# Patient Record
Sex: Male | Born: 1970 | Race: Black or African American | Hispanic: No | Marital: Married | Smoking: Never smoker
Health system: Southern US, Community
[De-identification: ages and names within clinical notes are randomized; demographics above are authoritative.]

## PROBLEM LIST (undated history)

## (undated) DIAGNOSIS — F419 Anxiety disorder, unspecified: Secondary | ICD-10-CM

## (undated) DIAGNOSIS — D649 Anemia, unspecified: Secondary | ICD-10-CM

## (undated) DIAGNOSIS — M109 Gout, unspecified: Secondary | ICD-10-CM

## (undated) DIAGNOSIS — I10 Essential (primary) hypertension: Secondary | ICD-10-CM

## (undated) HISTORY — DX: Essential (primary) hypertension: I10

## (undated) HISTORY — PX: NO PAST SURGERIES: SHX2092

## (undated) HISTORY — DX: Anemia, unspecified: D64.9

## (undated) HISTORY — DX: Anxiety disorder, unspecified: F41.9

---

## 2007-01-25 ENCOUNTER — Emergency Department (HOSPITAL_COMMUNITY): Admission: EM | Admit: 2007-01-25 | Discharge: 2007-01-25 | Payer: Self-pay | Admitting: Emergency Medicine

## 2008-02-13 ENCOUNTER — Encounter: Admission: RE | Admit: 2008-02-13 | Discharge: 2008-02-13 | Payer: Self-pay | Admitting: Internal Medicine

## 2008-11-24 ENCOUNTER — Emergency Department (HOSPITAL_COMMUNITY): Admission: EM | Admit: 2008-11-24 | Discharge: 2008-11-25 | Payer: Self-pay | Admitting: Emergency Medicine

## 2008-11-28 ENCOUNTER — Inpatient Hospital Stay (HOSPITAL_COMMUNITY): Admission: EM | Admit: 2008-11-28 | Discharge: 2008-11-30 | Payer: Self-pay | Admitting: Emergency Medicine

## 2011-04-19 NOTE — Discharge Summary (Signed)
NAMELEVEON, PELZER                 ACCOUNT NO.:  [id number]   MEDICAL RECORD NO.:  [id number]          PATIENT TYPE:  INP   LOCATION:  5523                         FACILITY:  [facility]   PHYSICIAN:  Michelene Gardener, MD    DATE OF BIRTH:  Dec 25, 1970   DATE OF ADMISSION:  11/28/2008  DATE OF DISCHARGE:  11/30/2008                               DISCHARGE SUMMARY   DISCHARGE DIAGNOSES:  1. Community-acquired pneumonia.  2. Leukocytosis, secondary to pneumonia.  3. Hypokalemia that was corrected.   DISCHARGE MEDICATIONS:  1. Avelox 400 mg p.o. once daily x1 week.  2. Robitussin 10 mL p.o. q.4 h. as needed.   CONSULTATIONS:  None.   PROCEDURES:  None.   RADIOLOGY STUDIES:  1. Chest x-ray on November 28, 2008, showed medial right upper lobe      air space disease consistent with pneumonia.  2. Repeat x-ray on November 29, 2008, showed persistent right upper      lobe pneumonia.  Followup with primary doctor within 1-2 weeks.   COURSE OF HOSPITALIZATION:  This is a 40 year old male with no past  medical history who presented to the hospital with clinical picture that  consistent with pneumonia.  He was initially evaluated on November 24, 2008, in [facility] ER and at that time he was given antibiotics that  was not working.  Came in again with worsening symptoms and his x-ray  showed pneumonia.  The patient was admitted to the hospital for further  evaluation.  He was started on IV Rocephin and Zithromax.  He was put in  inhaler treatments as needed.  His saturation remained stable and he did  not require any oxygen.  Blood culture was done x2 and it was normal.  The patient improved very quick in the hospital.  His shortness of  breath is almost resolved.  At the time of discharge, the patient was  almost back to his baseline.  He still had some cough, which is mainly  dry with occasional sputum.  I gave him 1 week of Avelox.  I advised him  to follow with his primary doctor and  consider repeat of his x-ray.  His  white counts normalized with antibiotics.  His potassium has been in  the low side and that was corrected with potassium chloride.   Total assessment time is 40 minutes.      Michelene Gardener, MD  Electronically Signed    NAE/MEDQ  D:  11/30/2008  T:  11/30/2008  Job:  [id number]

## 2011-04-19 NOTE — H&P (Signed)
Theodore Morton, Theodore Morton                 ACCOUNT NO.:  [id number]   MEDICAL RECORD NO.:  [id number]          PATIENT TYPE:  INP   LOCATION:  5523                         FACILITY:  [facility]   PHYSICIAN:  Joylene John, MD       DATE OF BIRTH:  Aug 12, 1971   DATE OF ADMISSION:  11/28/2008  DATE OF DISCHARGE:                              HISTORY & PHYSICAL   REASON FOR ADMISSION:  Pneumonia.   HISTORY OF PRESENT ILLNESS:  This is a 40 year old African American male  with no significant past medical history who was recently here in the ER  on November 25, 2008, with upper respiratory tract symptoms.  The  patient's chest x-ray at that time was clear and he was thought to have  bronchitis.  The patient was started on doxycycline and discharged.  The  patient tells me that following his discharge, he remained febrile in  spite of taking doxycycline.  He went to his primary care physicians on  November 26, 2008, where he was given a shot of antibiotics (the patient  is unsure of the name of an antibiotic).  The patient tells me that  following that shot, his fever defervesced; however, starting yesterday,  he started having increasing abdominal pain, was febrile again, was  nauseous, unable to keep p.o. down with vomiting x2.  The patient  decided to come to the ER for further management.  In the ER, chest x-  ray was done which showed right upper lobe pneumonia.  In the ER, the  patient was given ceftriaxone and azithromycin and the patient was  admitted to [facility] for further management.   PAST MEDICAL HISTORY:  No significant past medical history.   SOCIAL HISTORY:  No alcohol, drugs, or tobacco.   FAMILY HISTORY:  Noncontributory.   ALLERGIES:  No known allergies.   REVIEW OF SYSTEMS:  Positive for fever and cough.  Otherwise, the 14-  point review of system was negative.  Chest x-ray shows medial right  upper lobe and paratracheal opacity.   Laboratory and Imaging  Pertinent  labs show white count of 11.6, hemoglobin 11.9, hematocrit 35,  platelets 445.  Sodium 137, potassium 3.6, chloride 103, bicarb 10,  creatinine 1.2, glucose 106.   PHYSICAL EXAMINATION:  VITAL SIGNS:  Temperature of 102, blood pressure  129/88, pulse of 128, respirations 16, O2 sat is 98% on room air.  GENERAL:  The patient is in no acute distress.  No JVD appreciated.  No  icterus or pallor seen.  LUNGS:  Demonstrates crackles present right upper lobe with increased  egophony.  CARDIOVASCULAR:  Tachycardic.  LOWER EXTREMITIES:  No edema appreciated.   ASSESSMENT AND PLAN:  This is a 40 year old African American male being  admitted for IV antibiotics and treatment of his pneumonia.  We will  admit the patient to a regular bed.  We will continue the patient on  ceftriaxone  and azithromycin IV.  Once the patient is afebrile for 24-48 hours, he  can be transitioned to oral antibiotics.  The patient tells me that he  is hungry.  We will give antiemetic medications and following which, we  will allow the patient to have liquids and then to advance diet as  tolerated.      Joylene John, MD  Electronically Signed     RP/MEDQ  D:  11/28/2008  T:  11/29/2008  Job:  [id number]

## 2011-09-09 LAB — CBC
Hemoglobin: 10 g/dL — ABNORMAL LOW (ref 13.0–17.0)
MCV: 86.9 fL (ref 78.0–100.0)
Platelets: 409 10*3/uL — ABNORMAL HIGH (ref 150–400)
Platelets: 445 10*3/uL — ABNORMAL HIGH (ref 150–400)
RBC: 3.57 MIL/uL — ABNORMAL LOW (ref 4.22–5.81)
RDW: 11.9 % (ref 11.5–15.5)
WBC: 11.6 10*3/uL — ABNORMAL HIGH (ref 4.0–10.5)
WBC: 9.4 10*3/uL (ref 4.0–10.5)
WBC: 9.9 10*3/uL (ref 4.0–10.5)

## 2011-09-09 LAB — POTASSIUM: Potassium: 3.7 mEq/L (ref 3.5–5.1)

## 2011-09-09 LAB — COMPREHENSIVE METABOLIC PANEL
ALT: 24 U/L (ref 0–53)
AST: 20 U/L (ref 0–37)
CO2: 26 mEq/L (ref 19–32)
Chloride: 104 mEq/L (ref 96–112)
Creatinine, Ser: 1.12 mg/dL (ref 0.4–1.5)
GFR calc Af Amer: >60 mL/min (ref >60)
GFR calc non Af Amer: >60 mL/min (ref >60)
Glucose, Bld: 118 mg/dL — ABNORMAL HIGH (ref 70–99)
Sodium: 138 mEq/L (ref 135–145)
Total Bilirubin: 0.6 mg/dL (ref 0.3–1.2)

## 2011-09-09 LAB — POCT I-STAT, CHEM 8
BUN: 10 mg/dL (ref 6–23)
Creatinine, Ser: 1.2 mg/dL (ref 0.4–1.5)
Potassium: 3.6 mEq/L (ref 3.5–5.1)
Sodium: 137 mEq/L (ref 135–145)
TCO2: 24 mmol/L (ref 0–100)

## 2011-09-09 LAB — DIFFERENTIAL
Basophils Absolute: 0 10*3/uL (ref 0.0–0.1)
Eosinophils Relative: 0 % (ref 0–5)
Lymphocytes Relative: 10 % — ABNORMAL LOW (ref 12–46)
Lymphs Abs: 1.2 10*3/uL (ref 0.7–4.0)
Neutro Abs: 9.9 10*3/uL — ABNORMAL HIGH (ref 1.7–7.7)
Neutrophils Relative %: 85 % — ABNORMAL HIGH (ref 43–77)

## 2011-09-09 LAB — CULTURE, BLOOD (ROUTINE X 2)

## 2011-09-09 LAB — MAGNESIUM: Magnesium: 2.1 mg/dL (ref 1.5–2.5)

## 2014-12-22 ENCOUNTER — Emergency Department: Payer: Self-pay | Admitting: Emergency Medicine

## 2014-12-22 LAB — CBC
HCT: 37.4 % — ABNORMAL LOW (ref 40.0–52.0)
HGB: 12 g/dL — AB (ref 13.0–18.0)
MCH: 28.3 pg (ref 26.0–34.0)
MCHC: 32 g/dL (ref 32.0–36.0)
MCV: 88 fL (ref 80–100)
PLATELETS: 333 10*3/uL (ref 150–440)
RBC: 4.23 10*6/uL — AB (ref 4.40–5.90)
RDW: 12.4 % (ref 11.5–14.5)
WBC: 5.7 10*3/uL (ref 3.8–10.6)

## 2014-12-22 LAB — BASIC METABOLIC PANEL
ANION GAP: 7 (ref 7–16)
BUN: 15 mg/dL (ref 7–18)
CALCIUM: 8.4 mg/dL — AB (ref 8.5–10.1)
Chloride: 106 mmol/L (ref 98–107)
Co2: 27 mmol/L (ref 21–32)
Creatinine: 1.45 mg/dL — ABNORMAL HIGH (ref 0.60–1.30)
GFR CALC NON AF AMER: 56 — AB
GLUCOSE: 97 mg/dL (ref 65–99)
OSMOLALITY: 280 (ref 275–301)
Potassium: 3.9 mmol/L (ref 3.5–5.1)
Sodium: 140 mmol/L (ref 136–145)

## 2014-12-22 LAB — TROPONIN I: Troponin-I: <0.02 ng/mL

## 2015-11-08 ENCOUNTER — Emergency Department
Admission: EM | Admit: 2015-11-08 | Discharge: 2015-11-08 | Disposition: A | Discharge status: Home / Self Care | Payer: Managed Care, Other (non HMO) | Attending: Emergency Medicine | Admitting: Emergency Medicine

## 2015-11-08 DIAGNOSIS — Y9289 Other specified places as the place of occurrence of the external cause: Secondary | ICD-10-CM | POA: Insufficient documentation

## 2015-11-08 DIAGNOSIS — IMO0002 Reserved for concepts with insufficient information to code with codable children: Secondary | ICD-10-CM

## 2015-11-08 DIAGNOSIS — Y998 Other external cause status: Secondary | ICD-10-CM | POA: Diagnosis not present

## 2015-11-08 DIAGNOSIS — W268XXA Contact with other sharp object(s), not elsewhere classified, initial encounter: Secondary | ICD-10-CM | POA: Insufficient documentation

## 2015-11-08 DIAGNOSIS — S61011A Laceration without foreign body of right thumb without damage to nail, initial encounter: Secondary | ICD-10-CM | POA: Insufficient documentation

## 2015-11-08 DIAGNOSIS — Y93G1 Activity, food preparation and clean up: Secondary | ICD-10-CM | POA: Insufficient documentation

## 2015-11-08 MED ORDER — LIDOCAINE HCL (PF) 1 % IJ SOLN
INTRAMUSCULAR | Status: AC
  Administered 2015-11-08: 5 mL via INTRADERMAL
  Filled 2015-11-08: qty 5

## 2015-11-08 MED ORDER — LIDOCAINE HCL (PF) 1 % IJ SOLN
5.0000 mL | Freq: Once | INTRAMUSCULAR | Status: AC
  Administered 2015-11-08: 5 mL via INTRADERMAL

## 2015-11-08 MED ORDER — BACITRACIN ZINC 500 UNIT/GM EX OINT
1.0000 "application " | TOPICAL_OINTMENT | Freq: Two times a day (BID) | CUTANEOUS | Status: DC
  Administered 2015-11-08: 1 via TOPICAL
  Filled 2015-11-08: qty 0.9

## 2015-11-08 NOTE — ED Notes (Signed)
Laceration to right hand thumb with broken dish.

## 2015-11-08 NOTE — ED Provider Notes (Signed)
[facility] Emergency Department Provider Note ____________________________________________  Time seen: Approximately 10:21 AM  I have reviewed the triage vital signs and the nursing notes.   HISTORY  Chief Complaint Laceration   HPI Theodore Morton is a 44 y.o. male who presents to the emergency department for evaluation of a laceration to the right thumb. A plate broke this morning while washing dishes and cut the top of the thumb. No difficulty moving the thumb. Bleeding well controlled.  History reviewed. No pertinent past medical history.  There are no active problems to display for this patient.   History reviewed. No pertinent past surgical history.  No current outpatient prescriptions on file.  Allergies Review of patient's allergies indicates no known allergies.  No family history on file.  Social History Social History  Substance Use Topics  . Smoking status: Never Smoker   . Smokeless tobacco: None  . Alcohol Use: None    Review of Systems   Constitutional: No fever/chills Eyes: No visual changes. ENT: No congestion or rhinorrhea Cardiovascular: Denies chest pain. Respiratory: Denies shortness of breath. Gastrointestinal: No abdominal pain.  No nausea, no vomiting.  No diarrhea.  No constipation. Genitourinary: Negative for dysuria. Musculoskeletal: Negative for back pain. Skin: Positive for laceration. Neurological: Negative for headaches, focal weakness or numbness.  10-point ROS otherwise negative.  ____________________________________________   PHYSICAL EXAM:  VITAL SIGNS: ED Triage Vitals  Enc Vitals Group     BP 11/08/15 0855 166/105 mmHg     Pulse Rate 11/08/15 0855 79     Resp 11/08/15 0855 20     Temp 11/08/15 0855 98 F (36.7 C)     Temp Source 11/08/15 0855 Oral     SpO2 11/08/15 0855 96 %     Weight 11/08/15 0855 200 lb (90.719 kg)     Height 11/08/15 0855  (1.676 m)     Head Cir --      Peak Flow  --      Pain Score 11/08/15 0856 5     Pain Loc --      Pain Edu? --      Excl. in GC? --     Constitutional: Alert and oriented. Well appearing and in no acute distress. Eyes: Conjunctivae are normal. PERRL. EOMI. Head: Atraumatic. Nose: No congestion/rhinnorhea. Mouth/Throat: Mucous membranes are moist.  Oropharynx non-erythematous. No oral lesions. Neck: No stridor. Cardiovascular: Normal rate, regular rhythm.  Good peripheral circulation. Respiratory: Normal respiratory effort.  No retractions. Lungs CTAB. Gastrointestinal: Soft and nontender. No distention. No abdominal bruits. Musculoskeletal: No lower extremity tenderness nor edema.  No joint effusions. Neurologic:  Normal speech and language. No gross focal neurologic deficits are appreciated. Speech is normal. No gait instability. Skin:  3cm laceration to the dorsal aspect of the right thumb; Negative for petechiae.  Psychiatric: Mood and affect are normal. Speech and behavior are normal.  ____________________________________________   LABS (all labs ordered are listed, but only abnormal results are displayed)  Labs Reviewed - No data to display ____________________________________________  EKG   ____________________________________________  RADIOLOGY  Not indicated. ____________________________________________   PROCEDURES  Procedure(s) performed:   LACERATION REPAIR Performed by: Kem Boroughs Authorized by: Kem Boroughs Consent: Verbal consent obtained. Risks and benefits: risks, benefits and alternatives were discussed Consent given by: patient Patient identity confirmed: provided demographic data Prepped and Draped in normal sterile fashion Wound explored  Laceration Location: dorsal aspect of the right thumb  Laceration Length: 3cm  No Foreign Bodies seen  or palpated  Anesthesia: local infiltration  Local anesthetic: lidocaine 1% without epinephrine  Anesthetic total: 3.5  ml  Irrigation method: syringe Amount of cleaning: standard  Skin closure: 5-0 Nylon  Number of sutures: 6  Technique: simple interrupted  Patient tolerance: Patient tolerated the procedure well with no immediate complications.  ____________________________________________   INITIAL IMPRESSION / ASSESSMENT AND PLAN / ED COURSE  Pertinent labs & imaging results that were available during my care of the patient were reviewed by me and considered in my medical decision making (see chart for details).  Patient was given wound care instructions. Bacitracin and sterile bandage applied. He is to follow up with the PCP or go to urgent care for suture removal in 10 days. He was advised to return to the emergency department for symptoms of concern if unable to schedule an appointment. ____________________________________________   FINAL CLINICAL IMPRESSION(S) / ED DIAGNOSES  Final diagnoses:  Laceration       Chinita PesterCari B Ellanor Feuerstein, FNP 11/08/15 1025  Sharyn CreamerMark Quale, MD 11/08/15 [phone]

## 2015-11-08 NOTE — ED Notes (Signed)
Right thumb lac. Bleeding controlled at this time

## 2016-11-04 ENCOUNTER — Ambulatory Visit (INDEPENDENT_AMBULATORY_CARE_PROVIDER_SITE_OTHER): Payer: Self-pay

## 2016-11-04 ENCOUNTER — Ambulatory Visit (INDEPENDENT_AMBULATORY_CARE_PROVIDER_SITE_OTHER): Payer: Self-pay | Admitting: Family Medicine

## 2016-11-04 ENCOUNTER — Encounter: Payer: Self-pay | Admitting: Family Medicine

## 2016-11-04 VITALS — BP 135/88 | HR 94 | Temp 98.4°F | Resp 14 | Ht 67.5 in | Wt 209.5 lb

## 2016-11-04 DIAGNOSIS — G8929 Other chronic pain: Secondary | ICD-10-CM

## 2016-11-04 DIAGNOSIS — M25532 Pain in left wrist: Secondary | ICD-10-CM

## 2016-11-04 DIAGNOSIS — Z130 Encounter for screening for diseases of the blood and blood-forming organs and certain disorders involving the immune mechanism: Secondary | ICD-10-CM

## 2016-11-04 DIAGNOSIS — I10 Essential (primary) hypertension: Secondary | ICD-10-CM | POA: Insufficient documentation

## 2016-11-04 DIAGNOSIS — M25512 Pain in left shoulder: Secondary | ICD-10-CM

## 2016-11-04 DIAGNOSIS — E669 Obesity, unspecified: Secondary | ICD-10-CM

## 2016-11-04 LAB — LIPID PANEL
Cholesterol: 183 mg/dL (ref 0–200)
HDL: 48.5 mg/dL (ref >39.00)
LDL Cholesterol: 112 mg/dL — ABNORMAL HIGH (ref 0–99)
NONHDL: 134.06
Total CHOL/HDL Ratio: 4
Triglycerides: 111 mg/dL (ref 0.0–149.0)
VLDL: 22.2 mg/dL (ref 0.0–40.0)

## 2016-11-04 LAB — COMPREHENSIVE METABOLIC PANEL
ALK PHOS: 57 U/L (ref 39–117)
ALT: 20 U/L (ref 0–53)
AST: 20 U/L (ref 0–37)
Albumin: 4.5 g/dL (ref 3.5–5.2)
BILIRUBIN TOTAL: 0.6 mg/dL (ref 0.2–1.2)
BUN: 16 mg/dL (ref 6–23)
CO2: 30 mEq/L (ref 19–32)
Calcium: 10 mg/dL (ref 8.4–10.5)
Chloride: 101 mEq/L (ref 96–112)
Creatinine, Ser: 1.22 mg/dL (ref 0.40–1.50)
GFR: 81.57 mL/min (ref >60.00)
GLUCOSE: 101 mg/dL — AB (ref 70–99)
POTASSIUM: 5.1 meq/L (ref 3.5–5.1)
SODIUM: 137 meq/L (ref 135–145)
TOTAL PROTEIN: 8 g/dL (ref 6.0–8.3)

## 2016-11-04 LAB — CBC
HCT: 37.7 % — ABNORMAL LOW (ref 39.0–52.0)
Hemoglobin: 12.2 g/dL — ABNORMAL LOW (ref 13.0–17.0)
MCHC: 32.4 g/dL (ref 30.0–36.0)
MCV: 87.2 fl (ref 78.0–100.0)
Platelets: 369 10*3/uL (ref 150.0–400.0)
RBC: 4.32 Mil/uL (ref 4.22–5.81)
RDW: 12.2 % (ref 11.5–15.5)
WBC: 4.1 10*3/uL (ref 4.0–10.5)

## 2016-11-04 LAB — HEMOGLOBIN A1C: HEMOGLOBIN A1C: 5.2 % (ref 4.6–6.5)

## 2016-11-04 MED ORDER — MELOXICAM 15 MG PO TABS: 15.0000 mg | ORAL_TABLET | Freq: Every day | ORAL | 0 refills | Status: DC

## 2016-11-04 NOTE — Assessment & Plan Note (Signed)
Prior diagnosis of hypertension. I need an additional reading to confirm hypertension per Will follow-up in one month.

## 2016-11-04 NOTE — Patient Instructions (Signed)
We will call with your results.  Take the medication daily with food.  Follow up in 1 month.  Take care  Dr. Adriana Simasook

## 2016-11-04 NOTE — Progress Notes (Signed)
Pre visit review using our clinic review tool, if applicable. No additional management support is needed unless otherwise documented below in the visit note. 

## 2016-11-04 NOTE — Assessment & Plan Note (Signed)
[facility]. Treating with Mobic. Follow up in 1 month.

## 2016-11-04 NOTE — Progress Notes (Signed)
Subjective:  Patient ID: Theodore RunnerRicky Borak, male    DOB: 05/13/1971  Age: 45 y.o. MRN: [id number]  CC: L arm pain (shoulder, elbow, wrist)  HPI Theodore Morton is a 45 y.o. male presents to the clinic today with the above complaint.  Left arm pain  Patient states that he's had severe arm pain for the past 3 months.  He states that the pain is predominantly in the left shoulder. He also had some recent elbow discomfort as well as wrist pain.  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  He does not recall any discrete injury. He does work nearly 7 days a week and this heavy objects the predominance of the day.  He states that the pain is constant in his shoulder. The pains of his wrist and elbow come and go.  He has taken some Aleve without significant improvement.  No other reported arthralgias.  He reports that he has improvement with rest but continues to have pain at home after long days work. He has difficulty lifting small objects at home after working all day.  No reported swelling.  No other associated symptoms. No other complaints at this time.  Hypertension  Patient reports a prior history of hypertension.  He is not on any medication currently.  I have no records of this.  His blood pressure is mildly elevated today.  We'll discuss.  PMH, Surgical Hx, Family Hx, Social History reviewed and updated as below.  Past Medical History:  Diagnosis Date  . Hypertension    Past Surgical History:  Procedure Laterality Date  . NO PAST SURGERIES     Family History  Problem Relation Age of Onset  . Breast cancer Mother   . Diabetes Father    Social History  Substance Use Topics  . Smoking status: Never Smoker  . Smokeless tobacco: Never Used  . Alcohol use 0.6 - 1.2 oz/week    1 - 2 Standard drinks or equivalent per week    Review of Systems  Musculoskeletal:       Left arm pain.  Psychiatric/Behavioral:       Stress.  All  other systems reviewed and are negative.  Objective:   Today's Vitals: BP 135/88 (BP Location: Left Arm, Patient Position: Sitting, Cuff Size: Large)   Pulse 94   Temp 98.4 F (36.9 C) (Oral)   Resp 14   Ht 5' 7.5" (1.715 m)   Wt 209 lb 8 oz (95 kg)   SpO2 98%   BMI 32.33 kg/m   Physical Exam  Constitutional: He is oriented to person, place, and time. He appears well-developed. No distress.  HENT:  Head: Normocephalic and atraumatic.  Mouth/Throat: Oropharynx is clear and moist.  Normal TM's bilaterally.   Eyes: Conjunctivae are normal. No scleral icterus.  Neck: Normal range of motion. Neck supple.  Cardiovascular: Normal rate and regular rhythm.   No murmur heard. Pulmonary/Chest: Effort normal and breath sounds normal. He has no wheezes. He has no rales.  Abdominal: Soft. He exhibits no distension. There is no tenderness. There is no rebound.  Musculoskeletal:  Shoulder: Left Inspection reveals no abnormalities, atrophy or asymmetry. Palpation with tenderness over AC joint. ROM is full in all planes. Rotator cuff strength normal throughout. No signs of impingement with negative Hawkin's tests, empty can.  Elbow: Left Unremarkable to inspection. Range of motion full pronation, supination, flexion, extension. Stable to varus, valgus stress. No discrete areas of tenderness  to palpation.  Wrist: Left Inspection normal with no visible erythema or swelling. ROM smooth and normal with good flexion and extension and ulnar/radial deviation that is symmetrical with opposite wrist. Palpation with tenderness diffusely over the dorsum of the wrist.    Neurological: He is alert and oriented to person, place, and time.  Skin: No rash noted.  Psychiatric: He has a normal mood and affect.  Vitals reviewed.  Assessment & Plan:   Problem List Items Addressed This Visit    Obesity (BMI 30-39.9)   Relevant Orders   Hemoglobin A1c   Comprehensive metabolic panel   Lipid panel     Left wrist pain    New problem. X-ray today. Treating with meloxicam.      Relevant Orders   DG Wrist Complete Left   Essential hypertension    Prior diagnosis of hypertension. I need an additional reading to confirm hypertension per Will follow-up in one month.      Chronic left shoulder pain - Primary    [facility]. [facility] today. Treating with Mobic. Follow up in 1 month.      Relevant Orders   DG Shoulder Left    Other Visit Diagnoses    Screening for deficiency anemia       Relevant Orders   CBC      Outpatient Encounter Prescriptions as of 11/04/2016  Medication Sig  . meloxicam (MOBIC) 15 MG tablet Take 1 tablet (15 mg total) by mouth daily.   No facility-administered encounter medications on file as of 11/04/2016.     Follow-up: 1 month  Colan Laymon DO [facility]

## 2016-11-04 NOTE — Assessment & Plan Note (Signed)
New problem. X-ray today. Treating with meloxicam.

## 2016-12-08 ENCOUNTER — Encounter: Payer: Self-pay | Admitting: Family Medicine

## 2016-12-08 ENCOUNTER — Ambulatory Visit (INDEPENDENT_AMBULATORY_CARE_PROVIDER_SITE_OTHER): Payer: BLUE CROSS/BLUE SHIELD | Admitting: Family Medicine

## 2016-12-08 VITALS — BP 123/84 | HR 110 | Temp 98.8°F | Resp 14 | Wt 210.4 lb

## 2016-12-08 DIAGNOSIS — G8929 Other chronic pain: Secondary | ICD-10-CM | POA: Diagnosis not present

## 2016-12-08 DIAGNOSIS — D649 Anemia, unspecified: Secondary | ICD-10-CM

## 2016-12-08 DIAGNOSIS — I10 Essential (primary) hypertension: Secondary | ICD-10-CM | POA: Diagnosis not present

## 2016-12-08 DIAGNOSIS — M25512 Pain in left shoulder: Secondary | ICD-10-CM | POA: Diagnosis not present

## 2016-12-08 NOTE — Assessment & Plan Note (Addendum)
New problem. Suspect from GI loss given reports of hematochezia. Stool cards and labs today. Will await results prior to sending to GI.

## 2016-12-08 NOTE — Assessment & Plan Note (Signed)
Resolved. Doing well on PRN Mobic (not using regularly currently).

## 2016-12-08 NOTE — Patient Instructions (Signed)
We will call regarding the labs.  You will likely need to see GI.  Follow up in 6 months.  Take care  Dr. Adriana Simasook

## 2016-12-08 NOTE — Assessment & Plan Note (Signed)
BP well controlled. Will continue to monitor.

## 2016-12-08 NOTE — Progress Notes (Signed)
Pre visit review using our clinic review tool, if applicable. No additional management support is needed unless otherwise documented below in the visit note. 

## 2016-12-08 NOTE — Progress Notes (Signed)
Subjective:  Patient ID: Theodore Morton, male    DOB: 1971/06/04  Age: 46 y.o. MRN: [id number]  CC: Follow up  HPI:  46 year old male presents for follow-up.  Left shoulder pain  Has improved dramatically. Has essentially resolved.  He is taking meloxicam as needed.  Doing well at this time.  Hypertension  BP improved today.  He is not on pharmacotherapy.  Anemia  New problem.  His most recent labs were obtained and revealed a mild anemia of 12.2. Normocytic anemia.  He endorsed no issues regarding hematochezia or melena at our prior visit  Today when asked, patient states that he's had intermittent hematochezia over the past year.  No associated pain. No issues with constipation. No history of hemorrhoids.  Patient denies fatigue, night sweats, fevers, chills. No changes in his bowel movements.  Patient is in need of further workup regarding his anemia including stool cards.  Social Hx   Social History   Social History  . Marital status: Married    Spouse name: N/A  . Number of children: N/A  . Years of education: N/A   Social History Main Topics  . Smoking status: Never Smoker  . Smokeless tobacco: Never Used  . Alcohol use 0.6 - 1.2 oz/week    1 - 2 Standard drinks or equivalent per week  . Drug use: No  . Sexual activity: Yes    Partners: Female   Other Topics Concern  . None   Social History Narrative  . None   Review of Systems  Constitutional: Negative.   Gastrointestinal: Positive for blood in stool.   Objective:  BP 123/84   Pulse (!) 110   Temp 98.8 F (37.1 C) (Oral)   Resp 14   Wt 210 lb 6.4 oz (95.4 kg)   SpO2 98%   BMI 32.47 kg/m   BP/Weight 12/08/2016 11/04/2016 11/08/2015  Systolic BP 123 135 166  Diastolic BP 84 88 105  Wt. (Lbs) 210.4 209.5 200  BMI 32.47 32.33 32.3   Physical Exam  Constitutional: He is oriented to person, place, and time. He appears well-developed. No distress.  Cardiovascular: Normal rate and regular  rhythm.   Pulmonary/Chest: Effort normal and breath sounds normal.  Musculoskeletal:  Normal ROM of the shoulder. No current tenderness.  Neurological: He is alert and oriented to person, place, and time.  Psychiatric: He has a normal mood and affect.  Vitals reviewed.  Lab Results  Component Value Date   WBC 4.1 11/04/2016   HGB 12.2 (L) 11/04/2016   HCT 37.7 (L) 11/04/2016   PLT 369.0 11/04/2016   GLUCOSE 101 (H) 11/04/2016   CHOL 183 11/04/2016   TRIG 111.0 11/04/2016   HDL 48.50 11/04/2016   LDLCALC 112 (H) 11/04/2016   ALT 20 11/04/2016   AST 20 11/04/2016   NA 137 11/04/2016   K 5.1 11/04/2016   CL 101 11/04/2016   CREATININE 1.22 11/04/2016   BUN 16 11/04/2016   CO2 30 11/04/2016   HGBA1C 5.2 11/04/2016    Assessment & Plan:   Problem List Items Addressed This Visit    Essential hypertension    BP well controlled. Will continue to monitor.      Chronic left shoulder pain - Primary    Resolved. Doing well on PRN Mobic (not using regularly currently).      Anemia    New problem. Suspect from GI loss given reports of hematochezia. Stool cards and labs today. Will await results prior to sending  to GI.      Relevant Orders   Fecal occult blood, imunochemical   Iron, TIBC and Ferritin Panel   B12   Folate   CBC     Follow-up: 6 months  Ashanti Ratti Adriana Simasook DO [facility]

## 2016-12-09 LAB — IRON,TIBC AND FERRITIN PANEL
%SAT: 14 % — AB (ref 15–60)
FERRITIN: 155 ng/mL (ref 20–380)
Iron: 47 ug/dL — ABNORMAL LOW (ref 50–180)
TIBC: 330 ug/dL (ref 250–425)

## 2016-12-09 LAB — CBC
HEMATOCRIT: 37.5 % — AB (ref 39.0–52.0)
HEMOGLOBIN: 12.2 g/dL — AB (ref 13.0–17.0)
MCHC: 32.5 g/dL (ref 30.0–36.0)
MCV: 87.3 fl (ref 78.0–100.0)
Platelets: 419 10*3/uL — ABNORMAL HIGH (ref 150.0–400.0)
RBC: 4.3 Mil/uL (ref 4.22–5.81)
RDW: 12.4 % (ref 11.5–15.5)
WBC: 5.5 10*3/uL (ref 4.0–10.5)

## 2016-12-09 LAB — FOLATE: Folate: 5.6 ng/mL — ABNORMAL LOW (ref >5.9)

## 2016-12-09 LAB — VITAMIN B12: Vitamin B-12: 149 pg/mL — ABNORMAL LOW (ref 211–911)

## 2016-12-12 ENCOUNTER — Other Ambulatory Visit: Payer: Self-pay | Admitting: Family Medicine

## 2016-12-12 MED ORDER — FERROUS SULFATE 325 (65 FE) MG PO TABS: 325.0000 mg | ORAL_TABLET | Freq: Two times a day (BID) | ORAL | 0 refills | Status: DC

## 2016-12-12 MED ORDER — FOLIC ACID 1 MG PO TABS: 1.0000 mg | ORAL_TABLET | Freq: Every day | ORAL | 0 refills | Status: DC

## 2016-12-13 ENCOUNTER — Other Ambulatory Visit: Payer: BLUE CROSS/BLUE SHIELD

## 2016-12-13 ENCOUNTER — Other Ambulatory Visit (INDEPENDENT_AMBULATORY_CARE_PROVIDER_SITE_OTHER): Payer: BLUE CROSS/BLUE SHIELD

## 2016-12-13 ENCOUNTER — Telehealth: Payer: Self-pay | Admitting: Family Medicine

## 2016-12-13 ENCOUNTER — Other Ambulatory Visit: Payer: Self-pay | Admitting: Family Medicine

## 2016-12-13 DIAGNOSIS — D649 Anemia, unspecified: Secondary | ICD-10-CM | POA: Diagnosis not present

## 2016-12-13 DIAGNOSIS — K625 Hemorrhage of anus and rectum: Secondary | ICD-10-CM | POA: Insufficient documentation

## 2016-12-13 LAB — FECAL OCCULT BLOOD, IMMUNOCHEMICAL: Fecal Occult Bld: NEGATIVE

## 2016-12-13 NOTE — Telephone Encounter (Signed)
No referral has been entered

## 2016-12-13 NOTE — Telephone Encounter (Signed)
-----   Message from Tommie SamsJayce G Cook, DO sent at 12/13/2016 11:10 AM EST ----- Negative. Still needs to see GI. Is the referral in?

## 2016-12-19 ENCOUNTER — Emergency Department (HOSPITAL_COMMUNITY): Payer: Worker's Compensation

## 2016-12-19 ENCOUNTER — Emergency Department (HOSPITAL_COMMUNITY)
Admission: EM | Admit: 2016-12-19 | Discharge: 2016-12-19 | Disposition: A | Discharge status: Home / Self Care | Payer: Worker's Compensation | Attending: Emergency Medicine | Admitting: Emergency Medicine

## 2016-12-19 ENCOUNTER — Encounter (HOSPITAL_COMMUNITY): Payer: Self-pay | Admitting: Emergency Medicine

## 2016-12-19 DIAGNOSIS — Y929 Unspecified place or not applicable: Secondary | ICD-10-CM | POA: Insufficient documentation

## 2016-12-19 DIAGNOSIS — M542 Cervicalgia: Secondary | ICD-10-CM | POA: Diagnosis not present

## 2016-12-19 DIAGNOSIS — Y939 Activity, unspecified: Secondary | ICD-10-CM | POA: Insufficient documentation

## 2016-12-19 DIAGNOSIS — W208XXA Other cause of strike by thrown, projected or falling object, initial encounter: Secondary | ICD-10-CM | POA: Insufficient documentation

## 2016-12-19 DIAGNOSIS — I10 Essential (primary) hypertension: Secondary | ICD-10-CM | POA: Diagnosis not present

## 2016-12-19 DIAGNOSIS — Y999 Unspecified external cause status: Secondary | ICD-10-CM | POA: Diagnosis not present

## 2016-12-19 DIAGNOSIS — S0990XA Unspecified injury of head, initial encounter: Secondary | ICD-10-CM | POA: Diagnosis present

## 2016-12-19 DIAGNOSIS — Z79899 Other long term (current) drug therapy: Secondary | ICD-10-CM | POA: Insufficient documentation

## 2016-12-19 MED ORDER — MORPHINE SULFATE (PF) 4 MG/ML IV SOLN
4.0000 mg | Freq: Once | INTRAVENOUS | Status: AC
Start: 2016-12-19 — End: 2016-12-19
  Administered 2016-12-19: 4 mg via INTRAVENOUS
  Filled 2016-12-19: qty 1

## 2016-12-19 MED ORDER — HYDROCODONE-ACETAMINOPHEN 5-325 MG PO TABS: 1.0000 | ORAL_TABLET | Freq: Four times a day (QID) | ORAL | 0 refills | Status: DC | PRN

## 2016-12-19 MED ORDER — MORPHINE SULFATE (PF) 4 MG/ML IV SOLN
4.0000 mg | Freq: Once | INTRAVENOUS | Status: AC
  Administered 2016-12-19: 4 mg via INTRAVENOUS
  Filled 2016-12-19: qty 1

## 2016-12-19 MED ORDER — ONDANSETRON HCL 4 MG/2ML IJ SOLN
4.0000 mg | Freq: Once | INTRAMUSCULAR | Status: AC
  Administered 2016-12-19: 4 mg via INTRAVENOUS
  Filled 2016-12-19: qty 2

## 2016-12-19 NOTE — ED Notes (Signed)
Pt to MRI

## 2016-12-19 NOTE — ED Notes (Signed)
Removed C collar per verbal order from MD. Gave pt crackers and gingerale

## 2016-12-19 NOTE — ED Provider Notes (Signed)
[facility]-EMER[facility]ENCY DEPT Provider Note   CSN: [id number] Arrival date & time: 12/19/16  1400     History   Chief Complaint Chief Complaint  Patient presents with  . Fall  . Head Injury    HPI Theodore Morton is a 46 y.o. male.  Patient is a 46 year old male with a history of hypertension presenting from work today after an injury. Patient states that he unloads rugs from pallets and he turned around and multiple rugs fell on his neck. He denies any head injury or loss of consciousness. He had no injury to his torso. He fell to 10 out of 10 pain in his neck and has tingling in his left arm. He denies any symptoms in his legs. The pain has persisted and is currently 9 out of 10 and sharp in nature. No shortness of breath or swallowing difficulty. He denies any visual changes.   The history is provided by the patient.    Past Medical History:  Diagnosis Date  . Hypertension     Patient Active Problem List   Diagnosis Date Noted  . BRBPR (bright red blood per rectum) 12/13/2016  . Anemia 12/08/2016  . Chronic left shoulder pain 11/04/2016  . Left wrist pain 11/04/2016  . Obesity (BMI 30-39.9) 11/04/2016  . Essential hypertension 11/04/2016    Past Surgical History:  Procedure Laterality Date  . NO PAST SURGERIES         Home Medications    Prior to Admission medications   Medication Sig Start Date End Date Taking? Authorizing Provider  ferrous sulfate 325 (65 FE) MG tablet Take 1 tablet (325 mg total) by mouth 2 (two) times daily with a meal. Patient not taking: Reported on 12/19/2016 12/12/16   Tommie Sams, DO  folic acid (FOLVITE) 1 MG tablet Take 1 tablet (1 mg total) by mouth daily. Patient not taking: Reported on 12/19/2016 12/12/16   Tommie Sams, DO  meloxicam (MOBIC) 15 MG tablet Take 1 tablet (15 mg total) by mouth daily. Patient not taking: Reported on 12/19/2016 11/04/16   Tommie Sams, DO    Family History Family History  Problem Relation Age of Onset  . Breast  cancer Mother   . Diabetes Father     Social History Social History  Substance Use Topics  . Smoking status: Never Smoker  . Smokeless tobacco: Never Used  . Alcohol use 0.6 - 1.2 oz/week    1 - 2 Standard drinks or equivalent per week     Allergies   Patient has no known allergies.   Review of Systems Review of Systems  All other systems reviewed and are negative.    Physical Exam Updated Vital Signs BP (!) 161/117   Pulse 96   Temp 97.7 F (36.5 C) (Oral)   Resp 12   SpO2 100%   Physical Exam  Constitutional: He is oriented to person, place, and time. He appears well-developed and well-nourished. No distress.  HENT:  Head: Normocephalic and atraumatic.  Mouth/Throat: Oropharynx is clear and moist.  Eyes: Conjunctivae and EOM are normal. Pupils are equal, round, and reactive to light.  Neck: Neck supple. Spinous process tenderness and muscular tenderness present. Decreased range of motion present.    Currently immobilized in a c-collar  Cardiovascular: Normal rate, regular rhythm and intact distal pulses.   No murmur heard. Pulmonary/Chest: Effort normal and breath sounds normal. No respiratory distress. He has no wheezes. He has no rales.  Abdominal: Soft. He exhibits no  distension. There is no tenderness. There is no rebound and no guarding.  Musculoskeletal: He exhibits no edema or tenderness.  Neurological: He is alert and oriented to person, place, and time. No sensory deficit.  Slight decreased strength in the left upper extremity compared to the right. 5 out of 5 strength in bilateral lower extremities.  Patient states tingling in the left upper arm but sensation is intact  Skin: Skin is warm and dry. No rash noted. No erythema.  Psychiatric: He has a normal mood and affect. His behavior is normal.  Nursing note and vitals reviewed.    ED Treatments / Results  Labs (all labs ordered are listed, but only abnormal results are displayed) Labs Reviewed -  No data to display  EKG  EKG Interpretation None       Radiology Ct Head Wo Contrast  Result Date: 12/19/2016 CLINICAL DATA:  Patient was hit in the back of head and neck with pallet containing carpeting. No loss of consciousness. Pain posteriorly. Hypertension. EXAM: CT HEAD WITHOUT CONTRAST CT CERVICAL SPINE WITHOUT CONTRAST TECHNIQUE: Multidetector CT imaging of the head and cervical spine was performed following the standard protocol without intravenous contrast. Multiplanar CT image reconstructions of the cervical spine were also generated. COMPARISON:  12/22/2014 FINDINGS: CT HEAD FINDINGS Brain: No evidence of acute infarction, hemorrhage, hydrocephalus, extra-axial collection or mass lesion/mass effect. Chronic minimal small vessel ischemic changes of periventricular white matter special along the frontal horns. Vascular: No hyperdense vessel or unexpected calcification. Skull: Normal. Negative for fracture or focal lesion. Sinuses/Orbits: No acute finding. Other: None. CT CERVICAL SPINE FINDINGS Alignment: Minimal reversal cervical lordosis possibly from spasm with positioning. Skull base and vertebrae: No acute fracture. No primary bone lesion or focal pathologic process. Soft tissues and spinal canal: No prevertebral fluid or swelling. No visible canal hematoma. Disc levels: Small disc-osteophyte complexes C2-3 and C4-5. Mild uncovertebral joint spurring and osteoarthritis on the bilaterally at C3-4 and C4-5 contributing to mild neural foraminal encroachment. Upper chest: Negative. Other: Unremarkable thyroid gland. Small cervical lymph nodes bilaterally. Mastoids are clear. IMPRESSION: Chronic small vessel ischemic change of periventricular white matter. No acute intracranial abnormality. No acute cervical spine fracture. Electronically Signed   By: Tollie Eth M.D.   On: 12/19/2016 15:03   Ct Cervical Spine Wo Contrast  Result Date: 12/19/2016 CLINICAL DATA:  Patient was hit in the back  of head and neck with pallet containing carpeting. No loss of consciousness. Pain posteriorly. Hypertension. EXAM: CT HEAD WITHOUT CONTRAST CT CERVICAL SPINE WITHOUT CONTRAST TECHNIQUE: Multidetector CT imaging of the head and cervical spine was performed following the standard protocol without intravenous contrast. Multiplanar CT image reconstructions of the cervical spine were also generated. COMPARISON:  12/22/2014 FINDINGS: CT HEAD FINDINGS Brain: No evidence of acute infarction, hemorrhage, hydrocephalus, extra-axial collection or mass lesion/mass effect. Chronic minimal small vessel ischemic changes of periventricular white matter special along the frontal horns. Vascular: No hyperdense vessel or unexpected calcification. Skull: Normal. Negative for fracture or focal lesion. Sinuses/Orbits: No acute finding. Other: None. CT CERVICAL SPINE FINDINGS Alignment: Minimal reversal cervical lordosis possibly from spasm with positioning. Skull base and vertebrae: No acute fracture. No primary bone lesion or focal pathologic process. Soft tissues and spinal canal: No prevertebral fluid or swelling. No visible canal hematoma. Disc levels: Small disc-osteophyte complexes C2-3 and C4-5. Mild uncovertebral joint spurring and osteoarthritis on the bilaterally at C3-4 and C4-5 contributing to mild neural foraminal encroachment. Upper chest: Negative. Other: Unremarkable thyroid gland. Small  cervical lymph nodes bilaterally. Mastoids are clear. IMPRESSION: Chronic small vessel ischemic change of periventricular white matter. No acute intracranial abnormality. No acute cervical spine fracture. Electronically Signed   By: Tollie Ethavid  Kwon M.D.   On: 12/19/2016 15:03    Procedures Procedures (including critical care time)  Medications Ordered in ED Medications  morphine 4 MG/ML injection 4 mg (4 mg Intravenous Given 12/19/16 1421)  ondansetron (ZOFRAN) injection 4 mg (4 mg Intravenous Given 12/19/16 1421)  morphine 4 MG/ML  injection 4 mg (4 mg Intravenous Given 12/19/16 1545)     Initial Impression / Assessment and Plan / ED Course  I have reviewed the triage vital signs and the nursing notes.  Pertinent labs & imaging results that were available during my care of the patient were reviewed by me and considered in my medical decision making (see chart for details).  Clinical Course    Patient presenting after multiple rugs fell on his neck with severe pain in his C-spine with tingling down the left arm. Is currently neurovascularly intact but possible mild decrease of strength on the left arm may be related to pain. No other signs of injury. Head and neck CT are negative for acute fracture but does have some arthritis in his neck. Pain is improved with morphine. We will get an MRI to further evaluate for cord contusion. Patient has normal function of his lower extremities. Patient care signed out at 1600 to Dr. Erin HearingMessner  Final Clinical Impressions(s) / ED Diagnoses   Final diagnoses:  None    New Prescriptions New Prescriptions   No medications on file     Gwyneth SproutWhitney Alexius Ellington, MD 12/19/16 1626

## 2016-12-19 NOTE — ED Triage Notes (Signed)
Pt was unloading pallet board that had a box of rolled up carpets in it. The box fell down a ramp and struck him in the back of the head. No LOC. Pt alert and oriented. Headache 10/10. Neck pain and thoracic pain. Pt has c collar on. HR 110, BP 153/114.

## 2016-12-20 ENCOUNTER — Telehealth: Payer: Self-pay | Admitting: Family Medicine

## 2016-12-20 NOTE — Telephone Encounter (Signed)
pts wife called and stated that while pt was at work he had a pallet of rugs fall on him. He was rushed to [facility] in [place]. He was not admitted. He is scheduled for HFU on Friday.

## 2016-12-20 NOTE — Telephone Encounter (Signed)
Pt spouse called and stated that pt was involved in an accident at work and wanted to speak with you in regards to that,. Please advise, thank you!  Call [facility] @ [phone]

## 2016-12-20 NOTE — Telephone Encounter (Signed)
LVTCB

## 2016-12-22 ENCOUNTER — Ambulatory Visit: Payer: BLUE CROSS/BLUE SHIELD

## 2016-12-23 ENCOUNTER — Encounter: Payer: Self-pay | Admitting: Family Medicine

## 2016-12-23 ENCOUNTER — Ambulatory Visit (INDEPENDENT_AMBULATORY_CARE_PROVIDER_SITE_OTHER): Payer: BLUE CROSS/BLUE SHIELD | Admitting: Family Medicine

## 2016-12-23 VITALS — BP 153/98 | HR 95 | Temp 98.6°F | Resp 14 | Wt 214.8 lb

## 2016-12-23 DIAGNOSIS — M542 Cervicalgia: Secondary | ICD-10-CM | POA: Diagnosis not present

## 2016-12-23 DIAGNOSIS — E538 Deficiency of other specified B group vitamins: Secondary | ICD-10-CM | POA: Diagnosis not present

## 2016-12-23 MED ORDER — MELOXICAM 15 MG PO TABS: 15.0000 mg | ORAL_TABLET | Freq: Every day | ORAL | 0 refills | Status: DC

## 2016-12-23 MED ORDER — CYANOCOBALAMIN 1000 MCG/ML IJ SOLN
1000.0000 ug | Freq: Once | INTRAMUSCULAR | Status: AC
  Administered 2016-12-23: 1000 ug via INTRAMUSCULAR

## 2016-12-23 MED ORDER — CYCLOBENZAPRINE HCL 10 MG PO TABS: 10.0000 mg | ORAL_TABLET | Freq: Three times a day (TID) | ORAL | 0 refills | Status: DC | PRN

## 2016-12-23 NOTE — Progress Notes (Signed)
Pre visit review using our clinic review tool, if applicable. No additional management support is needed unless otherwise documented below in the visit note. 

## 2016-12-23 NOTE — Patient Instructions (Addendum)
Mobic daily for the next 1-2 weeks.   Flexeril as needed.  Use vicodin only if needed.  Follow up in 1 month or sooner if needed.  We will arrange the physical therapy.  Take care  Dr. Adriana Simasook

## 2016-12-25 DIAGNOSIS — M542 Cervicalgia: Secondary | ICD-10-CM | POA: Insufficient documentation

## 2016-12-25 NOTE — Progress Notes (Signed)
Subjective:  Patient ID: Theodore Morton, male    DOB: 1971/03/22  Age: 46 y.o. MRN: [id number]  CC: ED follow up  HPI:  46 year old male presents for ED follow-up.  Patient suffered an injury at work on 1/15. He was working on a truck and several large rolls of carpet fail from overhead and landed on his neck/upper back. He was sent via EMS to the emergency department. He had a CT head as well as MRI. No fracture was noted. He does have some degenerative changes in his neck. Additionally, an MRI revealed multilevel foraminal narrowing that was moderate to severe on the right at C3-C5. He was given morphine for pain with improvement. He was discharged home on Vicodin.  He presents today for follow-up. He states he continues to have moderate to severe posterior neck pain as well as associated headache. He's been taking the Vicodin sparingly. No other medications. Additionally, patient states that he's having difficulty getting the image of the objects falling out of his head. This is quite distressing to him. He also reports upper cavity pain. No reports of numbness or tingling but he does have pain in both arms which is severe at times. Seems to be worse with movement. No known relieving factors. No other complaints or concerns at this time.  Social Hx   Social History   Social History  . Marital status: Married    Spouse name: N/A  . Number of children: N/A  . Years of education: N/A   Social History Main Topics  . Smoking status: Never Smoker  . Smokeless tobacco: Never Used  . Alcohol use 0.6 - 1.2 oz/week    1 - 2 Standard drinks or equivalent per week  . Drug use: No  . Sexual activity: Yes    Partners: Female   Other Topics Concern  . None   Social History Narrative  . None    Review of Systems  Musculoskeletal: Positive for neck pain.  Neurological: Positive for headaches.  Psychiatric/Behavioral: Positive for sleep disturbance. The patient is nervous/anxious.     Objective:  BP (!) 153/98   Pulse 95   Temp 98.6 F (37 C) (Oral)   Resp 14   Wt 214 lb 12.8 oz (97.4 kg)   SpO2 98%   BMI 33.15 kg/m   BP/Weight 12/23/2016 12/19/2016 12/08/2016  Systolic BP 153 149 123  Diastolic BP 98 98 84  Wt. (Lbs) 214.8 - 210.4  BMI 33.15 - 32.47   Physical Exam  Constitutional: He is oriented to person, place, and time. He appears well-developed. No distress.  Cardiovascular: Normal rate and regular rhythm.   Pulmonary/Chest: Effort normal and breath sounds normal.  Musculoskeletal:  Patient with tenderness to palpation of the right side of the posterior neck. Decreased range of motion particularly in extension.  Neurological: He is alert and oriented to person, place, and time.  Psychiatric:  Anxious.  Vitals reviewed.  Lab Results  Component Value Date   WBC 5.5 12/08/2016   HGB 12.2 (L) 12/08/2016   HCT 37.5 (L) 12/08/2016   PLT 419.0 (H) 12/08/2016   GLUCOSE 101 (H) 11/04/2016   CHOL 183 11/04/2016   TRIG 111.0 11/04/2016   HDL 48.50 11/04/2016   LDLCALC 112 (H) 11/04/2016   ALT 20 11/04/2016   AST 20 11/04/2016   NA 137 11/04/2016   K 5.1 11/04/2016   CL 101 11/04/2016   CREATININE 1.22 11/04/2016   BUN 16 11/04/2016   CO2 30 11/04/2016  HGBA1C 5.2 11/04/2016    Assessment & Plan:   Problem List Items Addressed This Visit    Neck pain - Primary    New problem. Advised to use the Vicodin that he has left only with severe pain. Mobic daily for next 1-2 weeks. Flexeril as needed. Follow up in 1 month.       Other Visit Diagnoses    B12 deficiency       Relevant Medications   cyanocobalamin ((VITAMIN B-12)) injection 1,000 mcg (Completed)      Meds ordered this encounter  Medications  . cyanocobalamin ((VITAMIN B-12)) injection 1,000 mcg  . meloxicam (MOBIC) 15 MG tablet    Sig: Take 1 tablet (15 mg total) by mouth daily.    Dispense:  60 tablet    Refill:  0  . cyclobenzaprine (FLEXERIL) 10 MG tablet    Sig: Take  1 tablet (10 mg total) by mouth 3 (three) times daily as needed for muscle spasms.    Dispense:  90 tablet    Refill:  0    Follow-up: 1 month  Jaicee Michelotti DO [facility] [place]

## 2016-12-25 NOTE — Assessment & Plan Note (Signed)
New problem. Advised to use the Vicodin that he has left only with severe pain. Mobic daily for next 1-2 weeks. Flexeril as needed. Follow up in 1 month.

## 2016-12-29 ENCOUNTER — Telehealth: Payer: Self-pay | Admitting: Family Medicine

## 2016-12-29 NOTE — Telephone Encounter (Signed)
Pt was seen at [facility] for workers comp. His Bp was 170 and the second time it 160/114. Pt was urged to contact our office in regards to this.

## 2016-12-29 NOTE — Telephone Encounter (Signed)
Spoke with pt schedule for 01/02/17 for 1115 a.m.

## 2016-12-29 NOTE — Telephone Encounter (Signed)
Pt called and had a couple of questions regarding his last follow up, he asked for you to call him. Thank you!  Call pt @ [phone]

## 2016-12-29 NOTE — Telephone Encounter (Signed)
Needs to check BP at home. He should have follow up with me.

## 2016-12-29 NOTE — Telephone Encounter (Signed)
LVTCB

## 2017-01-02 ENCOUNTER — Ambulatory Visit: Payer: BLUE CROSS/BLUE SHIELD | Admitting: Family Medicine

## 2017-01-03 ENCOUNTER — Other Ambulatory Visit: Payer: Self-pay | Admitting: Family Medicine

## 2017-01-03 ENCOUNTER — Telehealth: Payer: Self-pay | Admitting: Family Medicine

## 2017-01-03 ENCOUNTER — Encounter: Payer: Self-pay | Admitting: Nurse Practitioner

## 2017-01-03 DIAGNOSIS — F419 Anxiety disorder, unspecified: Secondary | ICD-10-CM

## 2017-01-03 MED ORDER — AMLODIPINE BESYLATE 5 MG PO TABS: 5.0000 mg | ORAL_TABLET | Freq: Every day | ORAL | 0 refills | Status: DC

## 2017-01-03 NOTE — Telephone Encounter (Signed)
Pt brother called back regarding GI referral, he states that he is scheduled next Wednesday (feb 7) @ [facility] and wanted him seen sooner.  I asked to speak to Clide CliffRicky since [place]. Informed him no one will be able to see him for his GI problems before Feb. 7 so he needs to keep the appointment and to call to see if there has been any cancellations so he may be able to be seen earlier. He is also requesting a referral to see psychiatry. Please advise

## 2017-01-03 NOTE — Telephone Encounter (Signed)
Pt brother called and stated that they need bp medication ASAp. Pt's bp at 1:30 was 165/113 and has been around there for the last couple of weeks. Pt has appt on 2/2. Please advise, thank you!  Call @ [phone]

## 2017-01-03 NOTE — Telephone Encounter (Signed)
Recommendations?

## 2017-01-03 NOTE — Telephone Encounter (Signed)
Rx and referral placed  

## 2017-01-03 NOTE — Telephone Encounter (Signed)
Pt would like to have the medication for BP sent. He would also still like to have referral for psychiatry.

## 2017-01-04 NOTE — Telephone Encounter (Signed)
Needs to see Ortho

## 2017-01-04 NOTE — Telephone Encounter (Signed)
Left message on voice mail to call.

## 2017-01-04 NOTE — Telephone Encounter (Signed)
Patient reported that he has pain that the hydrocodone has not helped with , he requested advise on a medication that would work. Contact [phone]

## 2017-01-04 NOTE — Telephone Encounter (Signed)
Patient is calling stating having back pain and lower back pain.  Laying down makes pain worse.  Standing seems to relieve  the pain.   Also complains  of neck pain with radiating to left arm.  No arm numbness in left arm , just pain.  Right arm pain.   Patient states he will see orthopaedic on Friday.  Hydrocodone not helping pain.   Please advise.

## 2017-01-05 NOTE — Telephone Encounter (Signed)
Patient advised of below.  Patient states BP left arm 168/114 on amlodipine 5 mg 1 day. Patient states he is drowsy having headache , still having pain in back and left arm pain, right arm pain.   Denies shortness of breath or chest pain.     Appointment with you tomorrow.   Please advise.

## 2017-01-05 NOTE — Telephone Encounter (Signed)
Left message to call.

## 2017-01-05 NOTE — Telephone Encounter (Signed)
Pt called back returning your call. Thank you!  Call pt @ [phone]

## 2017-01-06 ENCOUNTER — Ambulatory Visit (INDEPENDENT_AMBULATORY_CARE_PROVIDER_SITE_OTHER): Payer: BLUE CROSS/BLUE SHIELD | Admitting: Family Medicine

## 2017-01-06 ENCOUNTER — Encounter: Payer: Self-pay | Admitting: Family Medicine

## 2017-01-06 DIAGNOSIS — I10 Essential (primary) hypertension: Secondary | ICD-10-CM | POA: Diagnosis not present

## 2017-01-06 DIAGNOSIS — M549 Dorsalgia, unspecified: Secondary | ICD-10-CM | POA: Insufficient documentation

## 2017-01-06 DIAGNOSIS — M542 Cervicalgia: Secondary | ICD-10-CM | POA: Diagnosis not present

## 2017-01-06 DIAGNOSIS — M546 Pain in thoracic spine: Secondary | ICD-10-CM

## 2017-01-06 MED ORDER — DIAZEPAM 5 MG PO TABS: 5.0000 mg | ORAL_TABLET | Freq: Two times a day (BID) | ORAL | 1 refills | Status: DC | PRN

## 2017-01-06 NOTE — Patient Instructions (Signed)
Increase Norvasc to 10 mg daily.  Stop the flexeril.  Continue the mobic.  Try the Valium.  Best of luck at the orthopedist.  Take care  Dr. Adriana Simasook

## 2017-01-06 NOTE — Assessment & Plan Note (Signed)
Uncontrolled. Stopping Flexeril. Trial of valium for spasm.

## 2017-01-06 NOTE — Assessment & Plan Note (Signed)
Uncontrolled. BP's elevated at home. BP mildly elevated today. Increasing Norvasc to 10 mg daily.

## 2017-01-06 NOTE — Progress Notes (Signed)
Subjective:  Patient ID: Theodore Morton, male    DOB: 1971/11/03  Age: 46 y.o. MRN: [id number]  CC: Follow up HTN; Neck pain, back pain  HPI:  46 year old male presents for follow up.  HTN  BP's have been significantly elevated at home (150's-160's systolic).  I recently started Norvasc.  BP slightly elevated today.   Endorses compliance with medication.  Neck pain, Back pain  Patient recently suffered a neck injury (see prior notes).  He continues to have severe neck pain.  He is also experiencing severe back pain (thoracic and low back pain).  Pain medication and mobic has not helped significantly.  Muscle relaxants have not helped much either.  Worse with ROM.  No known relieving factors.  Reports associated arm pain bilaterally.   Social Hx   Social History   Social History  . Marital status: Married    Spouse name: N/A  . Number of children: N/A  . Years of education: N/A   Social History Main Topics  . Smoking status: Never Smoker  . Smokeless tobacco: Never Used  . Alcohol use 0.6 - 1.2 oz/week    1 - 2 Standard drinks or equivalent per week  . Drug use: No  . Sexual activity: Yes    Partners: Female   Other Topics Concern  . None   Social History Narrative  . None    Review of Systems  Constitutional: Negative.   Musculoskeletal: Positive for back pain and neck pain.  Psychiatric/Behavioral: The patient is nervous/anxious.    Objective:  BP 136/90   Pulse 95   Temp 98.3 F (36.8 C) (Oral)   Wt 214 lb 12.8 oz (97.4 kg)   SpO2 100%   BMI 33.15 kg/m   BP/Weight 01/06/2017 12/23/2016 12/19/2016  Systolic BP 136 153 149  Diastolic BP 90 98 98  Wt. (Lbs) 214.8 214.8 -  BMI 33.15 33.15 -   Physical Exam  Constitutional: He is oriented to person, place, and time. He appears well-developed. No distress.  Cardiovascular: Regular rhythm.   Tachycardic.  Pulmonary/Chest: Effort normal and breath sounds normal.  Musculoskeletal:  Neck,  upper thoracic spine - severe tenderness of the paraspinal musculature.   Neurological: He is alert and oriented to person, place, and time.  Psychiatric:  Anxious.  Vitals reviewed.  Lab Results  Component Value Date   WBC 5.5 12/08/2016   HGB 12.2 (L) 12/08/2016   HCT 37.5 (L) 12/08/2016   PLT 419.0 (H) 12/08/2016   GLUCOSE 101 (H) 11/04/2016   CHOL 183 11/04/2016   TRIG 111.0 11/04/2016   HDL 48.50 11/04/2016   LDLCALC 112 (H) 11/04/2016   ALT 20 11/04/2016   AST 20 11/04/2016   NA 137 11/04/2016   K 5.1 11/04/2016   CL 101 11/04/2016   CREATININE 1.22 11/04/2016   BUN 16 11/04/2016   CO2 30 11/04/2016   HGBA1C 5.2 11/04/2016    Assessment & Plan:   Problem List Items Addressed This Visit    Essential hypertension    Uncontrolled. BP's elevated at home. BP mildly elevated today. Increasing Norvasc to 10 mg daily.      Neck pain    Uncontrolled. Stopping Flexeril. Trial of valium for spasm.      Back pain    Trial of valium.         Meds ordered this encounter  Medications  . diazepam (VALIUM) 5 MG tablet    Sig: Take 1-2 tablets (5-10 mg total)  by mouth every 12 (twelve) hours as needed for anxiety or muscle spasms.    Dispense:  30 tablet    Refill:  1    Follow-up: Pending ortho eval.  Everlene OtherJayce Maeva Dant DO [facility]

## 2017-01-06 NOTE — Assessment & Plan Note (Signed)
Trial of valium.

## 2017-01-11 ENCOUNTER — Ambulatory Visit (INDEPENDENT_AMBULATORY_CARE_PROVIDER_SITE_OTHER): Payer: BLUE CROSS/BLUE SHIELD | Admitting: Nurse Practitioner

## 2017-01-11 ENCOUNTER — Encounter: Payer: Self-pay | Admitting: Nurse Practitioner

## 2017-01-11 VITALS — BP 121/84 | HR 68 | Ht 67.5 in | Wt 216.4 lb

## 2017-01-11 DIAGNOSIS — D649 Anemia, unspecified: Secondary | ICD-10-CM

## 2017-01-11 DIAGNOSIS — E538 Deficiency of other specified B group vitamins: Secondary | ICD-10-CM

## 2017-01-11 MED ORDER — NA SULFATE-K SULFATE-MG SULF 17.5-3.13-1.6 GM/177ML PO SOLN: 1.0000 | Freq: Once | ORAL | 0 refills | Status: AC

## 2017-01-11 NOTE — Progress Notes (Signed)
HPI:  Patient is a 46 yo male referred by PCP, Dr. Everlene OtherJayce Cook,  for evaluation of anemia. He has chronic anemia (mild) with baseline hgb around 12 dating back to at least 2009. Recent ferritin was 155, normal MCV. PCP recently started him on iron and folate. Mobic on home med list but patient states he doesn't take it or any other NSAID on a regular basis. No rectal bleeding or black stools. No bowel changes, nausea or other GI complaints .No FMH of colon cancer. No fatigue, SOB, or chest pain     Past Medical History:  Diagnosis Date  . Anxiety   . Hypertension     Past Surgical History:  Procedure Laterality Date  . NO PAST SURGERIES     Family History  Problem Relation Age of Onset  . Breast cancer Mother   . Diabetes Father    Social History  Substance Use Topics  . Smoking status: Never Smoker  . Smokeless tobacco: Never Used  . Alcohol use 0.6 - 1.2 oz/week    1 - 2 Standard drinks or equivalent per week   Current Outpatient Prescriptions  Medication Sig Dispense Refill  . amLODipine (NORVASC) 5 MG tablet Take 1 tablet (5 mg total) by mouth daily. 90 tablet 0  . diazepam (VALIUM) 5 MG tablet Take 1-2 tablets (5-10 mg total) by mouth every 12 (twelve) hours as needed for anxiety or muscle spasms. 30 tablet 1  . ferrous sulfate 325 (65 FE) MG tablet Take 1 tablet (325 mg total) by mouth 2 (two) times daily with a meal. 180 tablet 0  . folic acid (FOLVITE) 1 MG tablet Take 1 tablet (1 mg total) by mouth daily. 90 tablet 0  . meloxicam (MOBIC) 15 MG tablet Take 1 tablet (15 mg total) by mouth daily. 60 tablet 0   No current facility-administered medications for this visit.    No Known Allergies   Review of Systems: Positive for anxiety, back pain, cough and headaches. All other systems reviewed and negative except where noted in HPI.    Physical Exam: BP 121/84   Pulse 68   Ht 5' 7.5" (1.715 m)   Wt 216 lb 6 oz (98.1 kg)   BMI 33.39 kg/m  Constitutional:   Well-developed, black male in no acute distress. Psychiatric: Normal mood and affect. Behavior is normal. EENT: Pupils equal and round . Conjunctivae are normal. No scleral icterus. Neck supple.  Cardiovascular: Normal rate, regular rhythm.  Pulmonary/chest: Effort normal and breath sounds normal. No wheezing, rales or rhonchi. Abdominal: Soft, nondistended, nontender. Bowel sounds active throughout. There are no masses palpable. No hepatomegaly.  Rectal:  Heme negative by PCP on 12/13/16. No repreated Extremities: no edema Lymphadenopathy: No cervical adenopathy noted. Neurological: Alert and oriented to person place and time. Skin: Skin is warm and dry. No rashes noted.  ASSESSMENT AND PLAN:  46 year old male with chronic normocytic anemia, heme negative stools. Hgb has remained in low 12 range since 2016. Recent ferritin normal at 155. Folate is low, as is B12 ( ? Pernicious anemia).  No evidence for GI blood loss at this point but based on his age patient is appropriate for colon cancer screening -Patient will be scheduled for a screening colonoscopy with possible polypectomy.  The risks and benefits of the procedure were discussed and the patient agrees to proceed.  -Hematology referral made     Willette ClusterPaula Akemi Overholser, NP  01/11/2017, 9:52 AM  Cc: Tommie Samsook, Jayce G,  DO

## 2017-01-11 NOTE — Patient Instructions (Signed)
If you are age 48 or older, your body mass index should be between 23-30. Your Body mass index is 33.39 kg/m. If this is out of the aforementioned range listed, please consider follow up with your Primary Care Provider.  If you are age 48 or younger, your body mass index should be between 19-25. Your Body mass index is 33.39 kg/m. If this is out of the aformentioned range listed, please consider follow up with your Primary Care Provider.   We have sent the following medications to your pharmacy for you to pick up at your convenience:  Suprep  You have been scheduled for a colonoscopy. Please follow written instructions given to you at your visit today.  Please pick up your prep supplies at the pharmacy within the next 1-3 days. If you use inhalers (even only as needed), please bring them with you on the day of your procedure. Your physician has requested that you go to www.startemmi.com and enter the access code given to you at your visit today. This web site gives a general overview about your procedure. However, you should still follow specific instructions given to you by our office regarding your preparation for the procedure.  Please discontinue your Iron supplement as instructed by Gunnar FusiPaula.  You are being referred to Hematology. If you have not received a call about an appointment in 1 week please give our office a call.  Thank you.

## 2017-01-12 ENCOUNTER — Other Ambulatory Visit: Payer: Self-pay | Admitting: Family Medicine

## 2017-01-12 ENCOUNTER — Ambulatory Visit (INDEPENDENT_AMBULATORY_CARE_PROVIDER_SITE_OTHER): Payer: BLUE CROSS/BLUE SHIELD

## 2017-01-12 VITALS — BP 138/98 | HR 99 | Resp 16

## 2017-01-12 DIAGNOSIS — I10 Essential (primary) hypertension: Secondary | ICD-10-CM

## 2017-01-12 MED ORDER — HYDROCHLOROTHIAZIDE 12.5 MG PO CAPS: 12.5000 mg | ORAL_CAPSULE | Freq: Every day | ORAL | 3 refills | Status: AC

## 2017-01-12 NOTE — Progress Notes (Signed)
Patient comes in states blood pressure is elevated when he  checks at home is  152/114.  When  checked blood pressure with  home monitor in office today it was 157/114 pulse 100.  I confirmed patient was using home blood pressure monitor correctly.    Patient states he saw gastroenterologist yesterday and blood pressure was 121/84.  Patient states he was last seen on February 2nd and was increased to Amlodipine 10 mg  Daily.   Patient states he has been having headaches for the past couple of days.   Please advise.

## 2017-01-12 NOTE — Progress Notes (Signed)
Sending in additional meds.

## 2017-01-13 NOTE — Progress Notes (Signed)
LMTC to call home number

## 2017-01-13 NOTE — Progress Notes (Signed)
Patients wife advised medications sent in .

## 2017-01-16 NOTE — Progress Notes (Signed)
Reviewed and agree with management plan.  Nayelly Laughman T. Edwin Baines, MD FACG 

## 2017-01-24 ENCOUNTER — Encounter: Payer: Self-pay | Admitting: Family Medicine

## 2017-01-24 ENCOUNTER — Ambulatory Visit (INDEPENDENT_AMBULATORY_CARE_PROVIDER_SITE_OTHER): Payer: BLUE CROSS/BLUE SHIELD | Admitting: Family Medicine

## 2017-01-24 DIAGNOSIS — M542 Cervicalgia: Secondary | ICD-10-CM | POA: Diagnosis not present

## 2017-01-24 DIAGNOSIS — D649 Anemia, unspecified: Secondary | ICD-10-CM | POA: Diagnosis not present

## 2017-01-24 DIAGNOSIS — I10 Essential (primary) hypertension: Secondary | ICD-10-CM | POA: Diagnosis not present

## 2017-01-24 MED ORDER — AMLODIPINE BESYLATE 10 MG PO TABS: 10.0000 mg | ORAL_TABLET | Freq: Every day | ORAL | 1 refills | Status: DC

## 2017-01-24 NOTE — Assessment & Plan Note (Signed)
Improving. Continue PT. PRN Mobic.

## 2017-01-24 NOTE — Assessment & Plan Note (Signed)
BP elevated today. Increasing Norvasc to 10 mg daily. Continue HCTZ.

## 2017-01-24 NOTE — Patient Instructions (Signed)
I have increased the norvasc to 10 mg daily. Goal BP <130/80.  Follow up in 3-6 months. Keep an eye on your BPs at home.  Take care  Dr. Adriana Simasook

## 2017-01-24 NOTE — Progress Notes (Signed)
Pre visit review using our clinic review tool, if applicable. No additional management support is needed unless otherwise documented below in the visit note. 

## 2017-01-24 NOTE — Progress Notes (Signed)
   Subjective:  Patient ID: Francesco RunnerRicky Tinkey, male    DOB: 11/12/1971  Age: 46 y.o. MRN: [id number]  CC: Follow up   HPI:  46 year old male with anemia, HTN, ongoing neck pain/back pain from recent injury presents for followup.  HTN  Compliant with HCTZ and Norvasc.  BP slightly elevated today.  Anemia  Taking iron.  Has seen GI.  Has colonoscopy scheduled.   Neck pain/Back pain  Improving with PT.  Social Hx   Social History   Social History  . Marital status: Married    Spouse name: N/A  . Number of children: 2  . Years of education: N/A   Occupational History  . RETAIL    Social History Main Topics  . Smoking status: Never Smoker  . Smokeless tobacco: Never Used  . Alcohol use 0.6 - 1.2 oz/week    1 - 2 Standard drinks or equivalent per week  . Drug use: No  . Sexual activity: Yes    Partners: Female   Other Topics Concern  . None   Social History Narrative  . None    Review of Systems  Musculoskeletal: Positive for back pain and neck pain.  Psychiatric/Behavioral: The patient is nervous/anxious.    Objective:  BP (!) 131/92   Pulse 92   Temp 97.7 F (36.5 C) (Oral)   Wt 218 lb 12.8 oz (99.2 kg)   SpO2 97%   BMI 33.76 kg/m   BP/Weight 01/24/2017 01/12/2017 01/11/2017  Systolic BP 131 138 121  Diastolic BP 92 98 84  Wt. (Lbs) 218.8 - 216.38  BMI 33.76 - 33.39   Physical Exam  Constitutional: He is oriented to person, place, and time. He appears well-developed. No distress.  Cardiovascular: Normal rate and regular rhythm.   Pulmonary/Chest: Effort normal and breath sounds normal.  Neurological: He is alert and oriented to person, place, and time.  Psychiatric: He has a normal mood and affect.  Vitals reviewed.   Lab Results  Component Value Date   WBC 5.5 12/08/2016   HGB 12.2 (L) 12/08/2016   HCT 37.5 (L) 12/08/2016   PLT 419.0 (H) 12/08/2016   GLUCOSE 101 (H) 11/04/2016   CHOL 183 11/04/2016   TRIG 111.0 11/04/2016   HDL 48.50  11/04/2016   LDLCALC 112 (H) 11/04/2016   ALT 20 11/04/2016   AST 20 11/04/2016   NA 137 11/04/2016   K 5.1 11/04/2016   CL 101 11/04/2016   CREATININE 1.22 11/04/2016   BUN 16 11/04/2016   CO2 30 11/04/2016   HGBA1C 5.2 11/04/2016   Assessment & Plan:   Problem List Items Addressed This Visit    Neck pain    Improving. Continue PT. PRN Mobic.      Essential hypertension    BP elevated today. Increasing Norvasc to 10 mg daily. Continue HCTZ.      Relevant Medications   amLODipine (NORVASC) 10 MG tablet   Anemia    Stable. Continue iron. Has colonoscopy in April.         Meds ordered this encounter  Medications  . amLODipine (NORVASC) 10 MG tablet    Sig: Take 1 tablet (10 mg total) by mouth daily.    Dispense:  90 tablet    Refill:  1    Follow-up: Return for HTN follow up.  (3-6 months) Everlene OtherJayce Ally Knodel DO [facility] [place]

## 2017-01-24 NOTE — Assessment & Plan Note (Signed)
Stable. Continue iron. Has colonoscopy in April.

## 2017-03-09 ENCOUNTER — Encounter: Payer: BLUE CROSS/BLUE SHIELD | Admitting: Gastroenterology

## 2017-04-11 ENCOUNTER — Emergency Department
Admission: EM | Admit: 2017-04-11 | Discharge: 2017-04-12 | Disposition: A | Discharge status: Home / Self Care | Payer: Medicaid Other | Attending: Emergency Medicine | Admitting: Emergency Medicine

## 2017-04-11 ENCOUNTER — Encounter: Payer: Self-pay | Admitting: *Deleted

## 2017-04-11 DIAGNOSIS — Z79899 Other long term (current) drug therapy: Secondary | ICD-10-CM | POA: Diagnosis not present

## 2017-04-11 DIAGNOSIS — R45851 Suicidal ideations: Secondary | ICD-10-CM | POA: Diagnosis present

## 2017-04-11 DIAGNOSIS — F32A Depression, unspecified: Secondary | ICD-10-CM

## 2017-04-11 DIAGNOSIS — I10 Essential (primary) hypertension: Secondary | ICD-10-CM | POA: Insufficient documentation

## 2017-04-11 DIAGNOSIS — F329 Major depressive disorder, single episode, unspecified: Secondary | ICD-10-CM | POA: Diagnosis not present

## 2017-04-11 LAB — URINE DRUG SCREEN, QUALITATIVE (ARMC ONLY)
Amphetamines, Ur Screen: NOT DETECTED
BARBITURATES, UR SCREEN: NOT DETECTED
Benzodiazepine, Ur Scrn: NOT DETECTED
CANNABINOID 50 NG, UR ~~LOC~~: NOT DETECTED
COCAINE METABOLITE, UR ~~LOC~~: NOT DETECTED
MDMA (ECSTASY) UR SCREEN: NOT DETECTED
Methadone Scn, Ur: NOT DETECTED
OPIATE, UR SCREEN: NOT DETECTED
Phencyclidine (PCP) Ur S: NOT DETECTED
Tricyclic, Ur Screen: NOT DETECTED

## 2017-04-11 LAB — COMPREHENSIVE METABOLIC PANEL
ALBUMIN: 4.6 g/dL (ref 3.5–5.0)
ALK PHOS: 55 U/L (ref 38–126)
ALT: 32 U/L (ref 17–63)
AST: 29 U/L (ref 15–41)
Anion gap: 7 (ref 5–15)
BUN: 12 mg/dL (ref 6–20)
CALCIUM: 9.5 mg/dL (ref 8.9–10.3)
CO2: 27 mmol/L (ref 22–32)
CREATININE: 1.28 mg/dL — AB (ref 0.61–1.24)
Chloride: 104 mmol/L (ref 101–111)
GFR calc non Af Amer: >60 mL/min (ref >60)
GLUCOSE: 109 mg/dL — AB (ref 65–99)
Potassium: 4.2 mmol/L (ref 3.5–5.1)
SODIUM: 138 mmol/L (ref 135–145)
Total Bilirubin: 0.7 mg/dL (ref 0.3–1.2)
Total Protein: 8.7 g/dL — ABNORMAL HIGH (ref 6.5–8.1)

## 2017-04-11 LAB — ETHANOL: Alcohol, Ethyl (B): <5 mg/dL (ref <5)

## 2017-04-11 LAB — CBC
HEMATOCRIT: 38.1 % — AB (ref 40.0–52.0)
Hemoglobin: 12.5 g/dL — ABNORMAL LOW (ref 13.0–18.0)
MCH: 28.5 pg (ref 26.0–34.0)
MCHC: 32.7 g/dL (ref 32.0–36.0)
MCV: 87 fL (ref 80.0–100.0)
Platelets: 357 10*3/uL (ref 150–440)
RBC: 4.38 MIL/uL — ABNORMAL LOW (ref 4.40–5.90)
RDW: 12.6 % (ref 11.5–14.5)
WBC: 5.7 10*3/uL (ref 3.8–10.6)

## 2017-04-11 LAB — SALICYLATE LEVEL: Salicylate Lvl: <7 mg/dL (ref 2.8–30.0)

## 2017-04-11 LAB — ACETAMINOPHEN LEVEL: Acetaminophen (Tylenol), Serum: <10 ug/mL — ABNORMAL LOW (ref 10–30)

## 2017-04-11 NOTE — ED Notes (Signed)
Patient assigned to appropriate care area. Patient oriented to unit/care area: Informed that, for their safety, care areas are designed for safety and monitored by security cameras at all times; and visiting hours explained to patient. Patient verbalizes understanding, and verbal contract for safety obtained. 

## 2017-04-11 NOTE — ED Notes (Signed)
Dietary -Theodore Morton - reports that she will be sending a tray  Warm blanket provided

## 2017-04-11 NOTE — Discharge Instructions (Signed)
Please make sure you follow up at either [facility] or [facility] this week for a reevaluation. Your more than welcome to return to our emergency department at any point for any new or worsening symptoms such as if you feel like you might hurt yourself or for any other concerns.  It was a pleasure to take care of you today, and thank you for coming to our emergency department.  If you have any questions or concerns before leaving please ask the nurse to grab me and I'm more than happy to go through your aftercare instructions again.  If you were prescribed any opioid pain medication today such as Norco, Vicodin, Percocet, morphine, hydrocodone, or oxycodone please make sure you do not drive when you are taking this medication as it can alter your ability to drive safely.  If you have any concerns once you are home that you are not improving or are in fact getting worse before you can make it to your follow-up appointment, please do not hesitate to call 911 and come back for further evaluation.  Merrily BrittleNeil Dewayne Severe MD  Results for orders placed or performed during the hospital encounter of 04/11/17  Comprehensive metabolic panel  Result Value Ref Range   Sodium 138 135 - 145 mmol/L   Potassium 4.2 3.5 - 5.1 mmol/L   Chloride 104 101 - 111 mmol/L   CO2 27 22 - 32 mmol/L   Glucose, Bld 109 (H) 65 - 99 mg/dL   BUN 12 6 - 20 mg/dL   Creatinine, Ser 1.611.28 (H) 0.61 - 1.24 mg/dL   Calcium 9.5 8.9 - 09.610.3 mg/dL   Total Protein 8.7 (H) 6.5 - 8.1 g/dL   Albumin 4.6 3.5 - 5.0 g/dL   AST 29 15 - 41 U/L   ALT 32 17 - 63 U/L   Alkaline Phosphatase 55 38 - 126 U/L   Total Bilirubin 0.7 0.3 - 1.2 mg/dL   GFR calc non Af Amer >60 >60 mL/min   GFR calc Af Amer >60 >60 mL/min   Anion gap 7 5 - 15  Ethanol  Result Value Ref Range   Alcohol, Ethyl (B) <5 <5 mg/dL  Salicylate level  Result Value Ref Range   Salicylate Lvl <7.0 2.8 - 30.0 mg/dL  Acetaminophen level  Result Value Ref Range   Acetaminophen (Tylenol),  Serum <10 (L) 10 - 30 ug/mL  cbc  Result Value Ref Range   WBC 5.7 3.8 - 10.6 K/uL   RBC 4.38 (L) 4.40 - 5.90 MIL/uL   Hemoglobin 12.5 (L) 13.0 - 18.0 g/dL   HCT 04.538.1 (L) 40.940.0 - 81.152.0 %   MCV 87.0 80.0 - 100.0 fL   MCH 28.5 26.0 - 34.0 pg   MCHC 32.7 32.0 - 36.0 g/dL   RDW 91.412.6 78.211.5 - 95.614.5 %   Platelets 357 150 - 440 K/uL  Urine Drug Screen, Qualitative  Result Value Ref Range   Tricyclic, Ur Screen NONE DETECTED NONE DETECTED   Amphetamines, Ur Screen NONE DETECTED NONE DETECTED   MDMA (Ecstasy)Ur Screen NONE DETECTED NONE DETECTED   Cocaine Metabolite,Ur [place] NONE DETECTED NONE DETECTED   Opiate, Ur Screen NONE DETECTED NONE DETECTED   Phencyclidine (PCP) Ur S NONE DETECTED NONE DETECTED   Cannabinoid 50 Ng, Ur [place] NONE DETECTED NONE DETECTED   Barbiturates, Ur Screen NONE DETECTED NONE DETECTED   Benzodiazepine, Ur Scrn NONE DETECTED NONE DETECTED   Methadone Scn, Ur NONE DETECTED NONE DETECTED

## 2017-04-11 NOTE — ED Notes (Signed)

## 2017-04-11 NOTE — ED Triage Notes (Signed)
Pt arrived to ED reporting HTN. Pt reports he has been taking blood pressure medications normally and noticed.  today it was elevated and reports having a headache at this time. No changes in vision, no NVD, No neuro deficits noted.   Pt reported to family and friends today that he had thoughts of overdosing. PT now verbalized he would not kill himself because he would not let himself kill himself because he would never leave his family. Pt denies HI, hallucinations , drug use.

## 2017-04-11 NOTE — BH Assessment (Signed)
Assessment Note  Theodore Morton is an 46 y.o. male who presents to ED after [facility] mobile crisis clinician was told pt was suicidal with plan to overdose on B/P Rx. Pt reports "I've been having a rough time lately. I was injured at work ... I've been in pain, financially things are bad. I had a momentary lapse today. I started to think 'maybe if I took all of my blood pressure medicine' ... Then I snapped out of it and called my brother". Pt currently denies any thoughts of suicide and/or homicide. Pt states he became overwhelmed with recent stressors and financial strain. He has not been able to work due to an accident at work and is Psychologist, sport and exercise, but reports this has caused a significant strain on his family financially.   Pt reports the following symptoms of depression: crying, isolation, hopelessness, and worthlessness. He further reports decreased sleep patterns and weight gain due to limited physical activity. Pt denied alcohol/substance use. Pt denied hallucinations/delusions. He reports having a supportive family unit.  Pt was pleasant and verbalized good insight to today's events. He reports being open to the idea of seeking outpatient therapy treatment to address his stressors and gain healthy coping skills.  Diagnosis: Unspecified Adjustment Disorder  Past Medical History:  Past Medical History:  Diagnosis Date  . Anxiety   . Hypertension     Past Surgical History:  Procedure Laterality Date  . NO PAST SURGERIES      Family History:  Family History  Problem Relation Age of Onset  . Breast cancer Mother   . Diabetes Father     Social History:  reports that he has never smoked. He has never used smokeless tobacco. He reports that he drinks about 0.6 - 1.2 oz of alcohol per week . He reports that he does not use drugs.  Additional Social History:  Alcohol / Drug Use Pain Medications: None Reported Prescriptions: None Reported Over the Counter: None Reported History  of alcohol / drug use?: No history of alcohol / drug abuse  CIWA: CIWA-Ar BP: (!) 166/119 Pulse Rate: 100 COWS:    Allergies: No Known Allergies  Home Medications:  (Not in a hospital admission)  OB/GYN Status:  No LMP for male patient.  General Assessment Data Location of Assessment: [facility] ED TTS Assessment: In system Is this a Tele or Face-to-Face Assessment?: Face-to-Face Is this an Initial Assessment or a Re-assessment for this encounter?: Initial Assessment Marital status: Married (15 years) [place] name: N/A Is patient pregnant?: No Pregnancy Status: No Living Arrangements: Spouse/significant other, Children (Son and daughter (13yo & 14yo)) Can pt return to current living arrangement?: Yes Admission Status: Voluntary Is patient capable of signing voluntary admission?: Yes Referral Source: Self/Family/Friend Insurance type: Scientist, research (physical sciences) Exam [facility] Walk-in ONLY) Medical Exam completed: Yes  Crisis Care Plan Living Arrangements: Spouse/significant other, Children (Son and daughter (13yo & 14yo)) Legal Guardian: Other: (Self) Name of Psychiatrist: None Name of Therapist: None  Education Status Is patient currently in school?: No Current Grade: N/A Highest grade of school patient has completed: 12th Grade Name of school: N/A Contact person: N/A  Risk to self with the past 6 months Suicidal Ideation: No-Not Currently/Within Last 6 Months Has patient been a risk to self within the past 6 months prior to admission? : No Suicidal Intent: No-Not Currently/Within Last 6 Months Has patient had any suicidal intent within the past 6 months prior to admission? : No Is patient at risk for suicide?: No  Suicidal Plan?: No-Not Currently/Within Last 6 Months (Pt reports earlier he had plan to overdose on B/P Rx) Has patient had any suicidal plan within the past 6 months prior to admission? : No Access to Means: Yes (Pt has access to his B/P medication) Specify Access  to Suicidal Means: Access to his B/P Rx What has been your use of drugs/alcohol within the last 12 months?: None Previous Attempts/Gestures: No How many times?: 0 Other Self Harm Risks: None Triggers for Past Attempts: None known Intentional Self Injurious Behavior: None Family Suicide History: No Recent stressful life event(s): Loss (Comment), Job Loss, Financial Problems, Recent negative physical changes (Accident at work; Chronic pain (back & neck)) Persecutory voices/beliefs?: No Depression: Yes Depression Symptoms: Insomnia, Tearfulness, Isolating, Guilt, Loss of interest in usual pleasures, Feeling worthless/self pity, Feeling angry/irritable Substance abuse history and/or treatment for substance abuse?: No Suicide prevention information given to non-admitted patients: Not applicable  Risk to Others within the past 6 months Homicidal Ideation: No Does patient have any lifetime risk of violence toward others beyond the six months prior to admission? : No Thoughts of Harm to Others: No Current Homicidal Intent: No Current Homicidal Plan: No Access to Homicidal Means: No Identified Victim: N/A History of harm to others?: No Assessment of Violence: None Noted Violent Behavior Description: N/A Does patient have access to weapons?: No Criminal Charges Pending?: No Does patient have a court date: No Is patient on probation?: No  Psychosis Hallucinations: None noted Delusions: None noted  Mental Status Report Appearance/Hygiene: In scrubs Eye Contact: Good Motor Activity: Freedom of movement Speech: Logical/coherent Level of Consciousness: Alert Mood: Pleasant Affect: Appropriate to circumstance Anxiety Level: Minimal Thought Processes: Coherent, Relevant Judgement: Unimpaired Orientation: Person, Place, Time, Situation, Appropriate for developmental age Obsessive Compulsive Thoughts/Behaviors: None  Cognitive Functioning Concentration: Normal Memory: Recent Intact,  Remote Intact IQ: Average Insight: Good Impulse Control: Good Appetite: Good Weight Loss: 0 Weight Gain: 5 Sleep: Decreased Total Hours of Sleep: 6 Vegetative Symptoms: None  ADLScreening [facility] Assessment Services) Patient's cognitive ability adequate to safely complete daily activities?: Yes Patient able to express need for assistance with ADLs?: Yes Independently performs ADLs?: Yes (appropriate for developmental age)  Prior Inpatient Therapy Prior Inpatient Therapy: No Prior Therapy Dates: N/A Prior Therapy Facilty/Provider(s): N/A Reason for Treatment: N/A  Prior Outpatient Therapy Prior Outpatient Therapy: No Prior Therapy Dates: N/A Prior Therapy Facilty/Provider(s): N/A Reason for Treatment: N/A Does patient have an ACCT team?: No Does patient have Intensive In-House Services?  : No Does patient have [facility] services? : No Does patient have P4CC services?: No  ADL Screening (condition at time of admission) Patient's cognitive ability adequate to safely complete daily activities?: Yes Patient able to express need for assistance with ADLs?: Yes Independently performs ADLs?: Yes (appropriate for developmental age)       Abuse/Neglect Assessment (Assessment to be complete while patient is alone) Physical Abuse: Denies Verbal Abuse: Denies Sexual Abuse: Denies Exploitation of patient/patient's resources: Denies Self-Neglect: Denies Values / Beliefs Cultural Requests During Hospitalization: None Spiritual Requests During Hospitalization: None Consults Spiritual Care Consult Needed: No Social Work Consult Needed: No Merchant navy officer (For Healthcare) Does Patient Have a Medical Advance Directive?: No    Additional Information 1:1 In Past 12 Months?: No CIRT Risk: No Elopement Risk: No Does patient have medical clearance?: Yes  Child/Adolescent Assessment Running Away Risk:  (Patient is an adult)  Disposition:  Disposition Initial Assessment Completed  for this Encounter: Yes Disposition of Patient: Referred to (Psych  Consult) Patient referred to: Other (Comment) (Psych Consult)  On Site Evaluation by:   Reviewed with Physician:    Wilmon ArmsSTEVENSON, Countess Biebel 04/11/2017 9:19 PM

## 2017-04-11 NOTE — BH Assessment (Signed)
Clinician received a call from [facility] Mar[facility] Sago([facility] [phone]) stating that pt reported SI to brother via the phone w/ plan to OD on Rx. Representative [facility] to presenting to ED. Representative also reports elevated bp 177/124 and state pt did not comply w/ bp Rx today.   Clinician received second phone care from representative stating that pt has checked into the ED only reported c/o elevated blood pressure. Clinician informed charge RN Florentina Addison(Katie).

## 2017-04-11 NOTE — ED Notes (Signed)
Dr. Lamont Snowballifenbark talked to the Pt and base on the TTS consult, he feels comfortable sending the Pt home with resources for out Pt counseling. Pt is being discharged.

## 2017-04-11 NOTE — BH Assessment (Signed)
This Clinical research associatewriter provided pt with local outpatient resources for [facility].

## 2017-04-11 NOTE — ED Provider Notes (Signed)
[facility] Emergency Department Provider Note  ____________________________________________   First MD Initiated Contact with Patient 04/11/17 1833     (approximate)  I have reviewed the triage vital signs and the nursing notes.   HISTORY  Chief Complaint Hypertension and Suicidal   HPI Theodore Morton is a 46 y.o. male who comes to the emergency department requesting to talk to someone. He says that he has been under a tremendous amount of stress recently as he has been injured on the job and this is taking a toll on his family finances. He is unable to pay all of his pills and several bill collectors have been after him recently. Today he became upset and went for a drive when he was sitting at a park looking out he thought briefly for a moment that his family would be better off without him. He looked down at a bottle of his blood pressure medications and he wondered whether or not he should take them. His phone then clicked over to his same screen and he saw his son's face and he snapped out of it realizing that he had too much to live for. He then spoke with his friend who advised him to come to the hospital for help. He has never tried to kill himself. He contracts for safety. He does not have a gun in the home. He denies chest pain shortness of breath abdominal pain nausea or vomiting.   Past Medical History:  Diagnosis Date  . Anxiety   . Hypertension     Patient Active Problem List   Diagnosis Date Noted  . Back pain 01/06/2017  . Neck pain 12/25/2016  . BRBPR (bright red blood per rectum) 12/13/2016  . Anemia 12/08/2016  . Chronic left shoulder pain 11/04/2016  . Obesity (BMI 30-39.9) 11/04/2016  . Essential hypertension 11/04/2016    Past Surgical History:  Procedure Laterality Date  . NO PAST SURGERIES      Prior to Admission medications   Medication Sig Start Date End Date Taking? Authorizing Provider  amLODipine (NORVASC) 10 MG tablet  Take 1 tablet (10 mg total) by mouth daily. 01/24/17   Tommie Sams, DO  diazepam (VALIUM) 5 MG tablet Take 1-2 tablets (5-10 mg total) by mouth every 12 (twelve) hours as needed for anxiety or muscle spasms. 01/06/17   Tommie Sams, DO  ferrous sulfate 325 (65 FE) MG tablet Take 1 tablet (325 mg total) by mouth 2 (two) times daily with a meal. 12/12/16   Tommie Sams, DO  folic acid (FOLVITE) 1 MG tablet Take 1 tablet (1 mg total) by mouth daily. 12/12/16   Tommie Sams, DO  hydrochlorothiazide (MICROZIDE) 12.5 MG capsule Take 1 capsule (12.5 mg total) by mouth daily. 01/12/17   Tommie Sams, DO  meloxicam (MOBIC) 15 MG tablet Take 1 tablet (15 mg total) by mouth daily. 12/23/16   Tommie Sams, DO    Allergies Patient has no known allergies.  Family History  Problem Relation Age of Onset  . Breast cancer Mother   . Diabetes Father     Social History Social History  Substance Use Topics  . Smoking status: Never Smoker  . Smokeless tobacco: Never Used  . Alcohol use 0.6 - 1.2 oz/week    1 - 2 Standard drinks or equivalent per week    Review of Systems Constitutional: No fever/chills Eyes: No visual changes. ENT: No sore throat. Cardiovascular: Denies chest pain. Respiratory: Denies  shortness of breath. Gastrointestinal: No abdominal pain.  No nausea, no vomiting.  No diarrhea.  No constipation. Genitourinary: Negative for dysuria. Musculoskeletal: Negative for back pain. Skin: Negative for rash. Neurological: Negative for headaches, focal weakness or numbness.  10-point ROS otherwise negative.  ____________________________________________   PHYSICAL EXAM:  VITAL SIGNS: ED Triage Vitals  Enc Vitals Group     BP 04/11/17 1717 (!) 166/119     Pulse Rate 04/11/17 1717 100     Resp 04/11/17 1717 20     Temp 04/11/17 1717 98.7 F (37.1 C)     Temp Source 04/11/17 1717 Oral     SpO2 04/11/17 1717 98 %     Weight 04/11/17 1717 205 lb (93 kg)     Height 04/11/17 1717 5\' 7"   (1.702 m)     Head Circumference --      Peak Flow --      Pain Score 04/11/17 1730 10     Pain Loc --      Pain Edu? --      Excl. in GC? --     Constitutional: Alert and oriented x 4 well appearing nontoxic no diaphoresis speaks in full, clear sentences Eyes: PERRL EOMI. Head: Atraumatic. Nose: No congestion/rhinnorhea. Mouth/Throat: No trismus Neck: No stridor.   Cardiovascular: Normal rate, regular rhythm. Grossly normal heart sounds.  Good peripheral circulation. Respiratory: Normal respiratory effort.  No retractions. Lungs CTAB and moving good air Gastrointestinal: Soft nontender Musculoskeletal: No lower extremity edema   Neurologic:  Normal speech and language. No gross focal neurologic deficits are appreciated. Skin:  Skin is warm, dry and intact. No rash noted. Psychiatric: Tearful and somewhat sad affect    ____________________________________________    ____________________________________________   LABS (all labs ordered are listed, but only abnormal results are displayed)  Labs Reviewed  COMPREHENSIVE METABOLIC PANEL - Abnormal; Notable for the following:       Result Value   Glucose, Bld 109 (*)    Creatinine, Ser 1.28 (*)    Total Protein 8.7 (*)    All other components within normal limits  ACETAMINOPHEN LEVEL - Abnormal; Notable for the following:    Acetaminophen (Tylenol), Serum <10 (*)    All other components within normal limits  CBC - Abnormal; Notable for the following:    RBC 4.38 (*)    Hemoglobin 12.5 (*)    HCT 38.1 (*)    All other components within normal limits  ETHANOL  SALICYLATE LEVEL  URINE DRUG SCREEN, QUALITATIVE ([facility] ONLY)    Labs are unremarkable no signs of acute disease or ingestion __________________________________________  EKG   ____________________________________________  RADIOLOGY   ____________________________________________   PROCEDURES  Procedure(s) performed: no  Procedures  Critical Care  performed: no  Observation: no ____________________________________________   INITIAL IMPRESSION / ASSESSMENT AND PLAN / ED COURSE  Pertinent labs & imaging results that were available during my care of the patient were reviewed by me and considered in my medical decision making (see chart for details).  The patient arrives hemodynamically stable and well appearing. He and I had a lengthy discussion regarding his life stressors and he clearly says that he is not suicidal, has never attempted to kill himself, and he never would attempt to kill himself. He would like to speak to a therapist today instead of outpatient management so I will touch base with SOC and TTS.     The patient is not suicidal and he clearly contracts for safety. He had  a brief moment of weakness that was secondary to life stressors and is completely understandable. He would like to go home with outpatient management which I think is entirely reasonable. ____________________________________________   FINAL CLINICAL IMPRESSION(S) / ED DIAGNOSES  Final diagnoses:  Suicidal ideation  Depression, unspecified depression type      NEW MEDICATIONS STARTED DURING THIS VISIT:  Discharge Medication List as of 04/11/2017 10:48 PM       Note:  This document was prepared using Dragon voice recognition software and may include unintentional dictation errors.     Merrily Brittleifenbark, Mylasia Vorhees, MD 04/14/17 2240

## 2017-04-24 ENCOUNTER — Ambulatory Visit: Payer: BLUE CROSS/BLUE SHIELD | Admitting: Family Medicine

## 2017-04-24 DIAGNOSIS — Z0289 Encounter for other administrative examinations: Secondary | ICD-10-CM

## 2017-07-18 ENCOUNTER — Encounter: Payer: Self-pay | Admitting: Emergency Medicine

## 2017-07-18 ENCOUNTER — Emergency Department
Admission: EM | Admit: 2017-07-18 | Discharge: 2017-07-18 | Disposition: A | Discharge status: Home / Self Care | Payer: Medicaid Other | Attending: Emergency Medicine | Admitting: Emergency Medicine

## 2017-07-18 DIAGNOSIS — I10 Essential (primary) hypertension: Secondary | ICD-10-CM | POA: Diagnosis not present

## 2017-07-18 DIAGNOSIS — Z79899 Other long term (current) drug therapy: Secondary | ICD-10-CM | POA: Diagnosis not present

## 2017-07-18 LAB — CBC WITH DIFFERENTIAL/PLATELET
BASOS ABS: 0 10*3/uL (ref 0–0.1)
BASOS PCT: 1 %
EOS ABS: 0.2 10*3/uL (ref 0–0.7)
Eosinophils Relative: 3 %
HEMATOCRIT: 36.6 % — AB (ref 40.0–52.0)
HEMOGLOBIN: 12 g/dL — AB (ref 13.0–18.0)
Lymphocytes Relative: 33 %
Lymphs Abs: 1.8 10*3/uL (ref 1.0–3.6)
MCH: 28.5 pg (ref 26.0–34.0)
MCHC: 32.8 g/dL (ref 32.0–36.0)
MCV: 87.1 fL (ref 80.0–100.0)
MONOS PCT: 9 %
Monocytes Absolute: 0.5 10*3/uL (ref 0.2–1.0)
NEUTROS ABS: 3.1 10*3/uL (ref 1.4–6.5)
NEUTROS PCT: 54 %
Platelets: 347 10*3/uL (ref 150–440)
RBC: 4.2 MIL/uL — AB (ref 4.40–5.90)
RDW: 12.6 % (ref 11.5–14.5)
WBC: 5.6 10*3/uL (ref 3.8–10.6)

## 2017-07-18 LAB — COMPREHENSIVE METABOLIC PANEL
ALK PHOS: 51 U/L (ref 38–126)
ALT: 33 U/L (ref 17–63)
ANION GAP: 8 (ref 5–15)
AST: 28 U/L (ref 15–41)
Albumin: 4.3 g/dL (ref 3.5–5.0)
BILIRUBIN TOTAL: 0.8 mg/dL (ref 0.3–1.2)
BUN: 14 mg/dL (ref 6–20)
CALCIUM: 9.2 mg/dL (ref 8.9–10.3)
CO2: 24 mmol/L (ref 22–32)
CREATININE: 1.34 mg/dL — AB (ref 0.61–1.24)
Chloride: 105 mmol/L (ref 101–111)
Glucose, Bld: 102 mg/dL — ABNORMAL HIGH (ref 65–99)
Potassium: 4 mmol/L (ref 3.5–5.1)
SODIUM: 137 mmol/L (ref 135–145)
TOTAL PROTEIN: 7.9 g/dL (ref 6.5–8.1)

## 2017-07-18 NOTE — ED Triage Notes (Signed)
Pt states that he went to see his therapist this morning and his blood pressure was elevated. Therapist got 173/145 and 169/117 on recheck. Pt states that he does have hx/o HTN and takes medication. Pt states that he did take his medication this morning but thinks he missed a dose yesterday. Pt denies symptoms at this time. Pt does not appear to be in any distress.

## 2017-07-18 NOTE — Discharge Instructions (Signed)
Take blood pressure medication every day. Call and make an appointment with your primary care provider for follow-up of your blood pressure. Today you're hemoglobin was only minimally low. Your records indicate a you have been on iron supplements in the past. At this time I would suggest eating foods that are higher and iron such as red meats and dark green vegetables. Discussed this with Dr. Adriana Simasook.

## 2017-07-18 NOTE — ED Provider Notes (Signed)
[facility] Emergency Department Provider Note  ____________________________________________   First MD Initiated Contact with Patient 07/18/17 1307     (approximate)  I have reviewed the triage vital signs and the nursing notes.   HISTORY  Chief Complaint Hypertension   HPI Theodore Morton is a 46 y.o. male is here complaining of high blood pressure. Patient states that he was at physical therapy this morning where his blood pressure was 173/100 75. He states that he has a history of hypertension and takes medication. He states that he is unsure taking his medication yesterday but does remember taking it this morning. He states his blood pressure has not been this high in the past. He states that occasionally he has felt dizzy for the past 2 weeks. He denies working outside or having sweats. He does have an appointment with his PCP in the next week and half. He denies any headache or visual changes. He has continued his normal activities without any difficulties.   Past Medical History:  Diagnosis Date  . Anxiety   . Hypertension     Patient Active Problem List   Diagnosis Date Noted  . Back pain 01/06/2017  . Neck pain 12/25/2016  . BRBPR (bright red blood per rectum) 12/13/2016  . Anemia 12/08/2016  . Chronic left shoulder pain 11/04/2016  . Obesity (BMI 30-39.9) 11/04/2016  . Essential hypertension 11/04/2016    Past Surgical History:  Procedure Laterality Date  . NO PAST SURGERIES      Prior to Admission medications   Medication Sig Start Date End Date Taking? Authorizing Provider  amLODipine (NORVASC) 10 MG tablet Take 1 tablet (10 mg total) by mouth daily. 01/24/17   Tommie Sams, DO  diazepam (VALIUM) 5 MG tablet Take 1-2 tablets (5-10 mg total) by mouth every 12 (twelve) hours as needed for anxiety or muscle spasms. 01/06/17   Tommie Sams, DO  ferrous sulfate 325 (65 FE) MG tablet Take 1 tablet (325 mg total) by mouth 2 (two) times daily  with a meal. 12/12/16   Tommie Sams, DO  folic acid (FOLVITE) 1 MG tablet Take 1 tablet (1 mg total) by mouth daily. 12/12/16   Tommie Sams, DO  hydrochlorothiazide (MICROZIDE) 12.5 MG capsule Take 1 capsule (12.5 mg total) by mouth daily. 01/12/17   Tommie Sams, DO  meloxicam (MOBIC) 15 MG tablet Take 1 tablet (15 mg total) by mouth daily. 12/23/16   Tommie Sams, DO    Allergies Patient has no known allergies.  Family History  Problem Relation Age of Onset  . Breast cancer Mother   . Diabetes Father     Social History Social History  Substance Use Topics  . Smoking status: Never Smoker  . Smokeless tobacco: Never Used  . Alcohol use 0.6 - 1.2 oz/week    1 - 2 Standard drinks or equivalent per week    Review of Systems Constitutional: No fever/chills Eyes: No visual changes. ENT: No complaints Cardiovascular: Denies chest pain. Respiratory: Denies shortness of breath. Gastrointestinal: No abdominal pain.  No nausea, no vomiting.   Musculoskeletal: Negative for back pain. Skin: Negative for rash. Neurological: Negative for headaches, focal weakness or numbness.   ____________________________________________   PHYSICAL EXAM:  VITAL SIGNS: ED Triage Vitals  Enc Vitals Group     BP 07/18/17 1237 (!) 153/114     Pulse Rate 07/18/17 1237 92     Resp 07/18/17 1237 16     Temp  07/18/17 1237 98.1 F (36.7 C)     Temp Source 07/18/17 1237 Oral     SpO2 07/18/17 1237 97 %     Weight 07/18/17 1239 205 lb (93 kg)     Height --      Head Circumference --      Peak Flow --      Pain Score 07/18/17 1236 7     Pain Loc --      Pain Edu? --      Excl. in GC? --    Constitutional: Alert and oriented. Well appearing and in no acute distress. Eyes: Conjunctivae are normal.  Head: Atraumatic. Nose: No congestion/rhinnorhea. Neck: No stridor.   Cardiovascular: Normal rate, regular rhythm. Grossly normal heart sounds.  Good peripheral circulation. Respiratory: Normal  respiratory effort.  No retractions. Lungs CTAB. Musculoskeletal: Moves upper and lower extremities for a difficulty and normal gait was noted. Neurologic:  Normal speech and language. No gross focal neurologic deficits are appreciated. No gait instability. Skin:  Skin is warm, dry and intact. No rash noted. Psychiatric: Mood and affect are normal. Speech and behavior are normal.  ____________________________________________   LABS (all labs ordered are listed, but only abnormal results are displayed)  Labs Reviewed  COMPREHENSIVE METABOLIC PANEL - Abnormal; Notable for the following:       Result Value   Glucose, Bld 102 (*)    Creatinine, Ser 1.34 (*)    All other components within normal limits  CBC WITH DIFFERENTIAL/PLATELET - Abnormal; Notable for the following:    RBC 4.20 (*)    Hemoglobin 12.0 (*)    HCT 36.6 (*)    All other components within normal limits      PROCEDURES  Procedure(s) performed: None  Procedures  Critical Care performed: No  ____________________________________________   INITIAL IMPRESSION / ASSESSMENT AND PLAN / ED COURSE  Pertinent labs & imaging results that were available during my care of the patient were reviewed by me and considered in my medical decision making (see chart for details).  Patient was reassured that lab work with the exception of his hemoglobin was within normal limits. His hemoglobin actually was only minimally decreased. Patient has been on iron supplements in the past. At this time he was encouraged to eat red meat and dark green vegetables until he sees his PCP. We also discussed taking his blood pressure medication every day and the importance of doing so. Blood pressure had improved prior to discharge. Patient was actively making an appointment to see his PCP while in the emergency department.   ____________________________________________   FINAL CLINICAL IMPRESSION(S) / ED DIAGNOSES  Final diagnoses:  Essential  hypertension  Elevated blood pressure reading with diagnosis of hypertension      NEW MEDICATIONS STARTED DURING THIS VISIT:  Discharge Medication List as of 07/18/2017  2:22 PM       Note:  This document was prepared using Dragon voice recognition software and may include unintentional dictation errors.    Tommi RumpsSummers, Rosita Guzzetta L, PA-C 07/18/17 1446    Charlynne PanderYao, David Hsienta, MD 07/20/17 [phone]

## 2017-10-25 ENCOUNTER — Encounter: Payer: Self-pay | Admitting: Emergency Medicine

## 2017-10-25 ENCOUNTER — Emergency Department
Admission: EM | Admit: 2017-10-25 | Discharge: 2017-10-25 | Disposition: A | Discharge status: Home / Self Care | Payer: Medicaid Other | Attending: Emergency Medicine | Admitting: Emergency Medicine

## 2017-10-25 ENCOUNTER — Other Ambulatory Visit: Payer: Self-pay

## 2017-10-25 ENCOUNTER — Emergency Department: Payer: Medicaid Other

## 2017-10-25 DIAGNOSIS — Z79899 Other long term (current) drug therapy: Secondary | ICD-10-CM | POA: Insufficient documentation

## 2017-10-25 DIAGNOSIS — K529 Noninfective gastroenteritis and colitis, unspecified: Secondary | ICD-10-CM

## 2017-10-25 DIAGNOSIS — I880 Nonspecific mesenteric lymphadenitis: Secondary | ICD-10-CM | POA: Diagnosis not present

## 2017-10-25 DIAGNOSIS — I10 Essential (primary) hypertension: Secondary | ICD-10-CM | POA: Diagnosis not present

## 2017-10-25 DIAGNOSIS — K6389 Other specified diseases of intestine: Secondary | ICD-10-CM | POA: Insufficient documentation

## 2017-10-25 DIAGNOSIS — R1032 Left lower quadrant pain: Secondary | ICD-10-CM | POA: Diagnosis present

## 2017-10-25 LAB — CBC
HCT: 38.6 % — ABNORMAL LOW (ref 40.0–52.0)
Hemoglobin: 12.6 g/dL — ABNORMAL LOW (ref 13.0–18.0)
MCH: 28.6 pg (ref 26.0–34.0)
MCHC: 32.7 g/dL (ref 32.0–36.0)
MCV: 87.7 fL (ref 80.0–100.0)
PLATELETS: 335 10*3/uL (ref 150–440)
RBC: 4.4 MIL/uL (ref 4.40–5.90)
RDW: 12.6 % (ref 11.5–14.5)
WBC: 5.1 10*3/uL (ref 3.8–10.6)

## 2017-10-25 LAB — URINALYSIS, COMPLETE (UACMP) WITH MICROSCOPIC
Bacteria, UA: NONE SEEN
Bilirubin Urine: NEGATIVE
Glucose, UA: NEGATIVE mg/dL
Hgb urine dipstick: NEGATIVE
KETONES UR: NEGATIVE mg/dL
Leukocytes, UA: NEGATIVE
Nitrite: NEGATIVE
PH: 5 (ref 5.0–8.0)
PROTEIN: NEGATIVE mg/dL
SQUAMOUS EPITHELIAL / LPF: NONE SEEN
Specific Gravity, Urine: 1.014 (ref 1.005–1.030)

## 2017-10-25 LAB — COMPREHENSIVE METABOLIC PANEL
ALBUMIN: 4.3 g/dL (ref 3.5–5.0)
ALK PHOS: 56 U/L (ref 38–126)
ALT: 39 U/L (ref 17–63)
ANION GAP: 11 (ref 5–15)
AST: 35 U/L (ref 15–41)
BUN: 17 mg/dL (ref 6–20)
CALCIUM: 9.3 mg/dL (ref 8.9–10.3)
CO2: 22 mmol/L (ref 22–32)
Chloride: 102 mmol/L (ref 101–111)
Creatinine, Ser: 1.42 mg/dL — ABNORMAL HIGH (ref 0.61–1.24)
GFR, EST NON AFRICAN AMERICAN: 57 mL/min — AB (ref >60)
GLUCOSE: 151 mg/dL — AB (ref 65–99)
Potassium: 3.7 mmol/L (ref 3.5–5.1)
Sodium: 135 mmol/L (ref 135–145)
TOTAL PROTEIN: 8.4 g/dL — AB (ref 6.5–8.1)
Total Bilirubin: 0.9 mg/dL (ref 0.3–1.2)

## 2017-10-25 LAB — LIPASE, BLOOD: Lipase: 22 U/L (ref 11–51)

## 2017-10-25 MED ORDER — KETOROLAC TROMETHAMINE 30 MG/ML IJ SOLN
15.0000 mg | Freq: Once | INTRAMUSCULAR | Status: AC
Start: 2017-10-25 — End: 2017-10-25
  Administered 2017-10-25: 15 mg via INTRAVENOUS
  Filled 2017-10-25: qty 1

## 2017-10-25 MED ORDER — IOPAMIDOL (ISOVUE-300) INJECTION 61%
100.0000 mL | Freq: Once | INTRAVENOUS | Status: AC | PRN
  Administered 2017-10-25: 100 mL via INTRAVENOUS

## 2017-10-25 MED ORDER — OXYCODONE-ACETAMINOPHEN 5-325 MG PO TABS: 1.0000 | ORAL_TABLET | Freq: Four times a day (QID) | ORAL | 0 refills | Status: DC | PRN

## 2017-10-25 MED ORDER — ONDANSETRON 4 MG PO TBDP: 4.0000 mg | ORAL_TABLET | Freq: Three times a day (TID) | ORAL | 0 refills | Status: DC | PRN

## 2017-10-25 MED ORDER — SODIUM CHLORIDE 0.9 % IV BOLUS (SEPSIS)
1000.0000 mL | Freq: Once | INTRAVENOUS | Status: AC
  Administered 2017-10-25: 1000 mL via INTRAVENOUS

## 2017-10-25 NOTE — ED Provider Notes (Signed)
[facility] Emergency Department Provider Note  ____________________________________________   First MD Initiated Contact with Patient 10/25/17 2147     (approximate)  I have reviewed the triage vital signs and the nursing notes.   HISTORY  Chief Complaint Abdominal Pain    HPI Theodore Morton is a 46 y.o. male who self presents to the emergency department 2-3 days of left lower quadrant pain.  The pain is intermittent mild to moderate severity cramping.  It is nonradiating.  Nothing seems to make it better or worse.  He has had no diarrhea but has had more bowel movements a day than normal roughly 3 times.  He has had no nausea or vomiting.  He has no history of abdominal surgeries.  Past Medical History:  Diagnosis Date  . Anxiety   . Hypertension     Patient Active Problem List   Diagnosis Date Noted  . Back pain 01/06/2017  . Neck pain 12/25/2016  . BRBPR (bright red blood per rectum) 12/13/2016  . Anemia 12/08/2016  . Chronic left shoulder pain 11/04/2016  . Obesity (BMI 30-39.9) 11/04/2016  . Essential hypertension 11/04/2016    Past Surgical History:  Procedure Laterality Date  . NO PAST SURGERIES      Prior to Admission medications   Medication Sig Start Date End Date Taking? Authorizing Provider  amLODipine (NORVASC) 10 MG tablet Take 1 tablet (10 mg total) by mouth daily. 01/24/17   Tommie Samsook, Jayce G, DO  diazepam (VALIUM) 5 MG tablet Take 1-2 tablets (5-10 mg total) by mouth every 12 (twelve) hours as needed for anxiety or muscle spasms. 01/06/17   Tommie Samsook, Jayce G, DO  ferrous sulfate 325 (65 FE) MG tablet Take 1 tablet (325 mg total) by mouth 2 (two) times daily with a meal. 12/12/16   Tommie Samsook, Jayce G, DO  folic acid (FOLVITE) 1 MG tablet Take 1 tablet (1 mg total) by mouth daily. 12/12/16   Tommie Samsook, Jayce G, DO  hydrochlorothiazide (MICROZIDE) 12.5 MG capsule Take 1 capsule (12.5 mg total) by mouth daily. 01/12/17   Tommie Samsook, Jayce G, DO  meloxicam (MOBIC)  15 MG tablet Take 1 tablet (15 mg total) by mouth daily. 12/23/16   Tommie Samsook, Jayce G, DO  ondansetron (ZOFRAN ODT) 4 MG disintegrating tablet Take 1 tablet (4 mg total) by mouth every 8 (eight) hours as needed for nausea or vomiting. 10/25/17   Merrily Brittleifenbark, Nini Cavan, MD  oxyCODONE-acetaminophen (ROXICET) 5-325 MG tablet Take 1 tablet by mouth every 6 (six) hours as needed for severe pain. 10/25/17   Merrily Brittleifenbark, Carrissa Taitano, MD    Allergies Patient has no known allergies.  Family History  Problem Relation Age of Onset  . Breast cancer Mother   . Diabetes Father     Social History Social History   Tobacco Use  . Smoking status: Never Smoker  . Smokeless tobacco: Never Used  Substance Use Topics  . Alcohol use: Yes    Alcohol/week: 0.6 - 1.2 oz    Types: 1 - 2 Standard drinks or equivalent per week  . Drug use: No    Review of Systems Constitutional: No fever/chills Eyes: No visual changes. ENT: No sore throat. Cardiovascular: Denies chest pain. Respiratory: Denies shortness of breath. Gastrointestinal: Positive for abdominal pain.  No nausea, no vomiting.  No diarrhea.  No constipation. Genitourinary: Negative for dysuria. Musculoskeletal: Negative for back pain. Skin: Negative for rash. Neurological: Negative for headaches, focal weakness or numbness.   ____________________________________________   PHYSICAL EXAM:  VITAL SIGNS: ED Triage Vitals [10/25/17 2103]  Enc Vitals Group     BP (!) 148/96     Pulse Rate (!) 110     Resp 20     Temp 98.4 F (36.9 C)     Temp Source Oral     SpO2 97 %     Weight 200 lb (90.7 kg)     Height 5\' 6"  (1.676 m)     Head Circumference      Peak Flow      Pain Score 6     Pain Loc      Pain Edu?      Excl. in GC?     Constitutional: Alert and oriented x4 very well-appearing nontoxic no diaphoresis speaks in full clear sentences Eyes: PERRL EOMI. Head: Atraumatic. Nose: No congestion/rhinnorhea. Mouth/Throat: No trismus Neck: No  stridor.   Cardiovascular: Tachycardic rate, regular rhythm. Grossly normal heart sounds.  Good peripheral circulation. Respiratory: Normal respiratory effort.  No retractions. Lungs CTAB and moving good air Gastrointestinal: Soft nondistended mild tenderness left lower quadrant with voluntary guarding but no rebound no frank peritonitis negative Rovsing's no McBurney's tenderness Musculoskeletal: No lower extremity edema   Neurologic:  Normal speech and language. No gross focal neurologic deficits are appreciated. Skin:  Skin is warm, dry and intact. No rash noted. Psychiatric: Mood and affect are normal. Speech and behavior are normal.    ____________________________________________   DIFFERENTIAL includes but not limited to  Appendicitis, diverticulitis, small bowel obstruction, kidney stone, pyelonephritis ____________________________________________   LABS (all labs ordered are listed, but only abnormal results are displayed)  Labs Reviewed  COMPREHENSIVE METABOLIC PANEL - Abnormal; Notable for the following components:      Result Value   Glucose, Bld 151 (*)    Creatinine, Ser 1.42 (*)    Total Protein 8.4 (*)    GFR calc non Af Amer 57 (*)    All other components within normal limits  CBC - Abnormal; Notable for the following components:   Hemoglobin 12.6 (*)    HCT 38.6 (*)    All other components within normal limits  URINALYSIS, COMPLETE (UACMP) WITH MICROSCOPIC - Abnormal; Notable for the following components:   Color, Urine YELLOW (*)    APPearance CLEAR (*)    All other components within normal limits  LIPASE, BLOOD    Blood work reviewed by me with no acute disease __________________________________________  EKG   ____________________________________________  RADIOLOGY  CT scan abdomen pelvis reviewed by me is complicated and shows possible lymphoma as well as epiploic  appendigitis ____________________________________________   PROCEDURES  Procedure(s) performed: no  Procedures  Critical Care performed: no  Observation: no ____________________________________________   INITIAL IMPRESSION / ASSESSMENT AND PLAN / ED COURSE  Pertinent labs & imaging results that were available during my care of the patient were reviewed by me and considered in my medical decision making (see chart for details).  The patient arrives slightly tachycardic although very well-appearing.  He does have some abnormal stools for the past 3 days along with left lower quadrant pain.  I discussed with him the likelihood of simple diverticulitis and I offered him treatment with antibiotics presumptively versus CT scan for definitive diagnosis and he opted for a CT scan which I think is reasonable.  He drove to the hospital today so he cannot have opioid pain medications at this point.     ----------------------------------------- 11:23 PM on 10/25/2017 -----------------------------------------  I had a lengthy discussion with  the patient regarding the findings on his CT scan and that is epiploic appendigitis should resolve without antibiotics in 24-48 hours.  More concerning is his mesenteric adenitis concerning for malignancy.  Will refer him to oncology as an outpatient and the patient verbalizes understanding and agreement with the follow-up plan.  He is discharged home in improved condition. ____________________________________________   FINAL CLINICAL IMPRESSION(S) / ED DIAGNOSES  Final diagnoses:  Epiploic appendagitis  Mesenteric adenitis      NEW MEDICATIONS STARTED DURING THIS VISIT:  This SmartLink is deprecated. Use AVSMEDLIST instead to display the medication list for a patient.   Note:  This document was prepared using Dragon voice recognition software and may include unintentional dictation errors.     Merrily Brittle, MD 10/25/17 2324

## 2017-10-25 NOTE — Discharge Instructions (Signed)
Today fortunately your blood work was reassuring, however your CT scan showed 2 things that are important to know about.  First you have epiploic appendage otitis on the left side of your stomach which is likely causing all of this pain.  This does not need antibiotics and should get better on its own in 24-48 hours.  If your symptoms do not improve in 48 hours or if in fact they are getting worse, if you develop a fever, or if you cannot eat or drink please do come back to the emergency department for further evaluation.  Second, your CT scan was concerning for possible lymphoma.  It is critically important that you make an appointment to follow-up with the oncologist to get further testing.  It was a pleasure to take care of you today, and thank you for coming to our emergency department.  If you have any questions or concerns before leaving please ask the nurse to grab me and I'm more than happy to go through your aftercare instructions again.  If you were prescribed any opioid pain medication today such as Norco, Vicodin, Percocet, morphine, hydrocodone, or oxycodone please make sure you do not drive when you are taking this medication as it can alter your ability to drive safely.  If you have any concerns once you are home that you are not improving or are in fact getting worse before you can make it to your follow-up appointment, please do not hesitate to call 911 and come back for further evaluation.  Merrily BrittleNeil Derreon Consalvo, MD  Results for orders placed or performed during the hospital encounter of 10/25/17  Lipase, blood  Result Value Ref Range   Lipase 22 11 - 51 U/L  Comprehensive metabolic panel  Result Value Ref Range   Sodium 135 135 - 145 mmol/L   Potassium 3.7 3.5 - 5.1 mmol/L   Chloride 102 101 - 111 mmol/L   CO2 22 22 - 32 mmol/L   Glucose, Bld 151 (H) 65 - 99 mg/dL   BUN 17 6 - 20 mg/dL   Creatinine, Ser 1.611.42 (H) 0.61 - 1.24 mg/dL   Calcium 9.3 8.9 - 09.610.3 mg/dL   Total Protein  8.4 (H) 6.5 - 8.1 g/dL   Albumin 4.3 3.5 - 5.0 g/dL   AST 35 15 - 41 U/L   ALT 39 17 - 63 U/L   Alkaline Phosphatase 56 38 - 126 U/L   Total Bilirubin 0.9 0.3 - 1.2 mg/dL   GFR calc non Af Amer 57 (L) >60 mL/min   GFR calc Af Amer >60 >60 mL/min   Anion gap 11 5 - 15  CBC  Result Value Ref Range   WBC 5.1 3.8 - 10.6 K/uL   RBC 4.40 4.40 - 5.90 MIL/uL   Hemoglobin 12.6 (L) 13.0 - 18.0 g/dL   HCT 04.538.6 (L) 40.940.0 - 81.152.0 %   MCV 87.7 80.0 - 100.0 fL   MCH 28.6 26.0 - 34.0 pg   MCHC 32.7 32.0 - 36.0 g/dL   RDW 91.412.6 78.211.5 - 95.614.5 %   Platelets 335 150 - 440 K/uL  Urinalysis, Complete w Microscopic  Result Value Ref Range   Color, Urine YELLOW (A) YELLOW   APPearance CLEAR (A) CLEAR   Specific Gravity, Urine 1.014 1.005 - 1.030   pH 5.0 5.0 - 8.0   Glucose, UA NEGATIVE NEGATIVE mg/dL   Hgb urine dipstick NEGATIVE NEGATIVE   Bilirubin Urine NEGATIVE NEGATIVE   Ketones, ur NEGATIVE NEGATIVE mg/dL  Protein, ur NEGATIVE NEGATIVE mg/dL   Nitrite NEGATIVE NEGATIVE   Leukocytes, UA NEGATIVE NEGATIVE   RBC / HPF 0-5 0 - 5 RBC/hpf   WBC, UA 0-5 0 - 5 WBC/hpf   Bacteria, UA NONE SEEN NONE SEEN   Squamous Epithelial / LPF NONE SEEN NONE SEEN   Mucus PRESENT    Ct Abdomen Pelvis W Contrast  Result Date: 10/25/2017 CLINICAL DATA:  Left lower abdominal pain and bloating. EXAM: CT ABDOMEN AND PELVIS WITH CONTRAST TECHNIQUE: Multidetector CT imaging of the abdomen and pelvis was performed using the standard protocol following bolus administration of intravenous contrast. CONTRAST:  100mL ISOVUE-300 IOPAMIDOL (ISOVUE-300) INJECTION 61% COMPARISON:  None. FINDINGS: Lower chest: Lung bases are clear. Hepatobiliary: Diffuse fatty infiltration of the liver. Poorly defined circumscribed low-attenuation lesions are demonstrated throughout the liver. Largest is centrally at the dome of the right liver and measures 1.6 cm diameter. Lesions could represent cyst but due to poor definition, cholangitis or fungal  infection, metastasis or lymphoma could also have this appearance. Follow-up with elective MRI is recommended for lesion characterization. Gallbladder is contracted, likely physiologic. No stones or inflammatory changes identified. No bile duct dilatation. Pancreas: Unremarkable. No pancreatic ductal dilatation or surrounding inflammatory changes. Spleen: Normal in size without focal abnormality. Adrenals/Urinary Tract: No adrenal gland nodules. Kidneys are symmetrical. Homogeneous nephrograms. No hydronephrosis or hydroureter. Bladder wall is diffusely thickened. This could be due to under distention or cystitis. Correlation with urinalysis is recommended. Stomach/Bowel: Stomach, small bowel, and colon are mostly decompressed. Scattered stool in the colon. No discrete wall thickening identified. Focal fat lobule adjacent to the mid descending colon with surrounding inflammation consistent with epiploic appendagitis. Appendix is normal. Vascular/Lymphatic: Normal caliber abdominal aorta. No aneurysm or dissection. Inferior vena cava is unremarkable. Throughout the mesentery, there are focal solid nodular structures with surrounding edema representing a target like appearance. These are likely lymph nodes. Changes could be due to a diffuse lymphadenitis or other infectious/inflammatory process or lymphoma. No pathologic enlargement of these nodes. Reproductive: Prostate gland is enlarged, measuring 4.5 cm diameter. Other: No free air or free fluid in the abdomen. Abdominal wall musculature appears intact. Musculoskeletal: No acute or significant osseous findings. IMPRESSION: 1. Somewhat poorly defined low-attenuation lesions throughout the liver, largest measuring 1.6 cm diameter. Differential diagnosis includes cysts, metastasis, lymphoma, or infectious process. 2. Lymph nodes diffusely throughout the mesentery demonstrate surrounding edema and targetoid pattern. Differential diagnosis includes  infectious/inflammatory process or lymphoma. 3. Epiploic appendagitis adjacent to the descending colon. 4. Diffuse fatty infiltration of the liver. Electronically Signed   By: Burman NievesWilliam  Stevens M.D.   On: 10/25/2017 22:36

## 2017-10-25 NOTE — ED Triage Notes (Signed)
Patient ambulatory to triage with steady gait, without difficulty or distress noted; pt reports last few days having left lower abd pain and bloating; denies hx of same, denies any accomp symptoms

## 2017-10-30 ENCOUNTER — Other Ambulatory Visit: Payer: Self-pay

## 2017-10-30 ENCOUNTER — Encounter: Payer: Self-pay | Admitting: Oncology

## 2017-10-30 ENCOUNTER — Inpatient Hospital Stay: Payer: Medicaid Other | Attending: Oncology | Admitting: Oncology

## 2017-10-30 ENCOUNTER — Inpatient Hospital Stay: Payer: Medicaid Other

## 2017-10-30 VITALS — BP 150/108 | HR 97 | Temp 97.9°F | Ht 68.5 in | Wt 235.9 lb

## 2017-10-30 DIAGNOSIS — R599 Enlarged lymph nodes, unspecified: Secondary | ICD-10-CM

## 2017-10-30 DIAGNOSIS — R935 Abnormal findings on diagnostic imaging of other abdominal regions, including retroperitoneum: Secondary | ICD-10-CM

## 2017-10-30 DIAGNOSIS — M549 Dorsalgia, unspecified: Secondary | ICD-10-CM | POA: Diagnosis not present

## 2017-10-30 DIAGNOSIS — Q438 Other specified congenital malformations of intestine: Secondary | ICD-10-CM

## 2017-10-30 DIAGNOSIS — K769 Liver disease, unspecified: Secondary | ICD-10-CM | POA: Diagnosis not present

## 2017-10-30 DIAGNOSIS — R1032 Left lower quadrant pain: Secondary | ICD-10-CM | POA: Diagnosis not present

## 2017-10-30 DIAGNOSIS — F419 Anxiety disorder, unspecified: Secondary | ICD-10-CM | POA: Diagnosis not present

## 2017-10-30 DIAGNOSIS — R140 Abdominal distension (gaseous): Secondary | ICD-10-CM | POA: Diagnosis not present

## 2017-10-30 DIAGNOSIS — Z79899 Other long term (current) drug therapy: Secondary | ICD-10-CM

## 2017-10-30 DIAGNOSIS — K6389 Other specified diseases of intestine: Secondary | ICD-10-CM

## 2017-10-30 DIAGNOSIS — I10 Essential (primary) hypertension: Secondary | ICD-10-CM

## 2017-10-30 DIAGNOSIS — K760 Fatty (change of) liver, not elsewhere classified: Secondary | ICD-10-CM | POA: Diagnosis not present

## 2017-10-30 LAB — CBC WITH DIFFERENTIAL/PLATELET
Basophils Absolute: 0 10*3/uL (ref 0–0.1)
Basophils Relative: 1 %
Eosinophils Absolute: 0.1 10*3/uL (ref 0–0.7)
Eosinophils Relative: 2 %
HEMATOCRIT: 37.3 % — AB (ref 40.0–52.0)
Hemoglobin: 12.4 g/dL — ABNORMAL LOW (ref 13.0–18.0)
Lymphocytes Relative: 31 %
Lymphs Abs: 1.6 10*3/uL (ref 1.0–3.6)
MCH: 28.9 pg (ref 26.0–34.0)
MCHC: 33.2 g/dL (ref 32.0–36.0)
MCV: 86.9 fL (ref 80.0–100.0)
Monocytes Absolute: 0.3 10*3/uL (ref 0.2–1.0)
Monocytes Relative: 6 %
Neutro Abs: 3.1 10*3/uL (ref 1.4–6.5)
Neutrophils Relative %: 60 %
Platelets: 358 10*3/uL (ref 150–440)
RBC: 4.29 MIL/uL — ABNORMAL LOW (ref 4.40–5.90)
RDW: 12.4 % (ref 11.5–14.5)
WBC: 5.1 10*3/uL (ref 3.8–10.6)

## 2017-10-30 LAB — COMPREHENSIVE METABOLIC PANEL
ALK PHOS: 61 U/L (ref 38–126)
ALT: 33 U/L (ref 17–63)
AST: 26 U/L (ref 15–41)
Albumin: 4.4 g/dL (ref 3.5–5.0)
Anion gap: 5 (ref 5–15)
BUN: 15 mg/dL (ref 6–20)
CALCIUM: 9.1 mg/dL (ref 8.9–10.3)
CO2: 27 mmol/L (ref 22–32)
Chloride: 101 mmol/L (ref 101–111)
Creatinine, Ser: 1.31 mg/dL — ABNORMAL HIGH (ref 0.61–1.24)
Glucose, Bld: 118 mg/dL — ABNORMAL HIGH (ref 65–99)
Potassium: 4.2 mmol/L (ref 3.5–5.1)
Sodium: 133 mmol/L — ABNORMAL LOW (ref 135–145)
TOTAL PROTEIN: 8.5 g/dL — AB (ref 6.5–8.1)
Total Bilirubin: 0.5 mg/dL (ref 0.3–1.2)

## 2017-10-30 NOTE — Progress Notes (Signed)
Hematology/Oncology Consult note [facility] Telephone:([phone]) [phone] [phone]  Patient Care Team: Tommie Samsook, Jayce G, DO as PCP - General (Family Medicine)   Name of the patient: Theodore Morton  [id number]  09/14/1971    Reason for referral-abnormal CT abdomen   Referring physician-Dr. Lamont Snowballifenbark  Date of visit: 10/30/17   History of presenting illness-patient is a 46 year old male who presented to the ER on 10/25/2017 with symptoms of left lower quadrant abdominal pain.  He underwent CT abdomen which showed low-attenuation lesions in the liver.  Largest one was 1.6 cm in diameter.  Lesions could represent cyst but due to poor definition cholangitis or fungal infection, metastases or lymphoma could have this appearance.  Patient also noted to have lymph nodes diffusely throughout the mesentery with surrounding edema.  Differential diagnosis includes infectious/inflammatory process or lymphoma.  Epiploic appendagitis adjacent to the descending colon.  CBC was normal except for a mild anemia of 12.6/38.6.  CMP revealed elevated creatinine of 1.42 which is stable as compared to 3 months prior when it was 1.34.  Currently patient reports that his abdominal pain is much better.  He also had symptoms of bloating and belching which have improved.  He denies any fever..  Denies any bleeding in his stool or urine.  He does use occasional NSAIDs for his back pain which has now improved.  He was out of work for the first 10 months of the year due to back injury and because of that he gained close to 20 pounds.  ECOG PS- 0  Pain scale- 0   Review of systems- Review of Systems  Constitutional: Negative for chills, fever, malaise/fatigue and weight loss.  HENT: Negative for congestion, ear discharge and nosebleeds.   Eyes: Negative for blurred vision.  Respiratory: Negative for cough, hemoptysis, sputum production, shortness of breath and wheezing.   Cardiovascular: Negative  for chest pain, palpitations, orthopnea and claudication.  Gastrointestinal: Negative for abdominal pain, blood in stool, constipation, diarrhea, heartburn, melena, nausea and vomiting.  Genitourinary: Negative for dysuria, flank pain, frequency, hematuria and urgency.  Musculoskeletal: Negative for back pain, joint pain and myalgias.  Skin: Negative for rash.  Neurological: Negative for dizziness, tingling, focal weakness, seizures, weakness and headaches.  Endo/Heme/Allergies: Does not bruise/bleed easily.  Psychiatric/Behavioral: Negative for depression and suicidal ideas. The patient does not have insomnia.     No Known Allergies  Patient Active Problem List   Diagnosis Date Noted  . Back pain 01/06/2017  . Neck pain 12/25/2016  . BRBPR (bright red blood per rectum) 12/13/2016  . Anemia 12/08/2016  . Chronic left shoulder pain 11/04/2016  . Obesity (BMI 30-39.9) 11/04/2016  . Essential hypertension 11/04/2016     Past Medical History:  Diagnosis Date  . Anxiety   . Hypertension      Past Surgical History:  Procedure Laterality Date  . NO PAST SURGERIES      Social History   Socioeconomic History  . Marital status: Married    Spouse name: Not on file  . Number of children: 2  . Years of education: Not on file  . Highest education level: Not on file  Social Needs  . Financial resource strain: Not on file  . Food insecurity - worry: Not on file  . Food insecurity - inability: Not on file  . Transportation needs - medical: Not on file  . Transportation needs - non-medical: Not on file  Occupational History  . Occupation: RETAIL  Tobacco Use  .  Smoking status: Never Smoker  . Smokeless tobacco: Never Used  Substance and Sexual Activity  . Alcohol use: Yes    Alcohol/week: 0.6 - 1.2 oz    Types: 1 - 2 Standard drinks or equivalent per week  . Drug use: No  . Sexual activity: Yes    Partners: Female  Other Topics Concern  . Not on file  Social History  Narrative  . Not on file     Family History  Problem Relation Age of Onset  . Breast cancer Mother   . Diabetes Father      Current Outpatient Medications:  .  amLODipine (NORVASC) 10 MG tablet, Take 1 tablet (10 mg total) by mouth daily., Disp: 90 tablet, Rfl: 1 .  hydrochlorothiazide (MICROZIDE) 12.5 MG capsule, Take 1 capsule (12.5 mg total) by mouth daily., Disp: 90 capsule, Rfl: 3   Physical exam:  Vitals:   10/30/17 1432 10/30/17 1440  BP:  (!) 150/108  Pulse:  97  Temp:  97.9 F (36.6 C)  TempSrc:  Tympanic  Weight: 235 lb 14.3 oz (107 kg)   Height: 5' 8.5" (1.74 m) 5' 8.5" (1.74 m)   Physical Exam  Constitutional: He is oriented to person, place, and time and well-developed, well-nourished, and in no distress.  HENT:  Head: Normocephalic and atraumatic.  Eyes: EOM are normal. Pupils are equal, round, and reactive to light.  Neck: Normal range of motion.  Cardiovascular: Normal rate, regular rhythm and normal heart sounds.  Pulmonary/Chest: Effort normal and breath sounds normal.  Abdominal: Soft. Bowel sounds are normal.  Neurological: He is alert and oriented to person, place, and time.  Skin: Skin is warm and dry.       CMP Latest Ref Rng & Units 10/25/2017  Glucose 65 - 99 mg/dL 191(Y151(H)  BUN 6 - 20 mg/dL 17  Creatinine 7.820.61 - 9.561.24 mg/dL 2.13(Y1.42(H)  Sodium 865135 - 784145 mmol/L 135  Potassium 3.5 - 5.1 mmol/L 3.7  Chloride 101 - 111 mmol/L 102  CO2 22 - 32 mmol/L 22  Calcium 8.9 - 10.3 mg/dL 9.3  Total Protein 6.5 - 8.1 g/dL 6.9(G8.4(H)  Total Bilirubin 0.3 - 1.2 mg/dL 0.9  Alkaline Phos 38 - 126 U/L 56  AST 15 - 41 U/L 35  ALT 17 - 63 U/L 39   CBC Latest Ref Rng & Units 10/25/2017  WBC 3.8 - 10.6 K/uL 5.1  Hemoglobin 13.0 - 18.0 g/dL 12.6(L)  Hematocrit 40.0 - 52.0 % 38.6(L)  Platelets 150 - 440 K/uL 335    No images are attached to the encounter.  Ct Abdomen Pelvis W Contrast  Result Date: 10/25/2017 CLINICAL DATA:  Left lower abdominal pain and  bloating. EXAM: CT ABDOMEN AND PELVIS WITH CONTRAST TECHNIQUE: Multidetector CT imaging of the abdomen and pelvis was performed using the standard protocol following bolus administration of intravenous contrast. CONTRAST:  100mL ISOVUE-300 IOPAMIDOL (ISOVUE-300) INJECTION 61% COMPARISON:  None. FINDINGS: Lower chest: Lung bases are clear. Hepatobiliary: Diffuse fatty infiltration of the liver. Poorly defined circumscribed low-attenuation lesions are demonstrated throughout the liver. Largest is centrally at the dome of the right liver and measures 1.6 cm diameter. Lesions could represent cyst but due to poor definition, cholangitis or fungal infection, metastasis or lymphoma could also have this appearance. Follow-up with elective MRI is recommended for lesion characterization. Gallbladder is contracted, likely physiologic. No stones or inflammatory changes identified. No bile duct dilatation. Pancreas: Unremarkable. No pancreatic ductal dilatation or surrounding inflammatory changes. Spleen: Normal in  size without focal abnormality. Adrenals/Urinary Tract: No adrenal gland nodules. Kidneys are symmetrical. Homogeneous nephrograms. No hydronephrosis or hydroureter. Bladder wall is diffusely thickened. This could be due to under distention or cystitis. Correlation with urinalysis is recommended. Stomach/Bowel: Stomach, small bowel, and colon are mostly decompressed. Scattered stool in the colon. No discrete wall thickening identified. Focal fat lobule adjacent to the mid descending colon with surrounding inflammation consistent with epiploic appendagitis. Appendix is normal. Vascular/Lymphatic: Normal caliber abdominal aorta. No aneurysm or dissection. Inferior vena cava is unremarkable. Throughout the mesentery, there are focal solid nodular structures with surrounding edema representing a target like appearance. These are likely lymph nodes. Changes could be due to a diffuse lymphadenitis or other  infectious/inflammatory process or lymphoma. No pathologic enlargement of these nodes. Reproductive: Prostate gland is enlarged, measuring 4.5 cm diameter. Other: No free air or free fluid in the abdomen. Abdominal wall musculature appears intact. Musculoskeletal: No acute or significant osseous findings. IMPRESSION: 1. Somewhat poorly defined low-attenuation lesions throughout the liver, largest measuring 1.6 cm diameter. Differential diagnosis includes cysts, metastasis, lymphoma, or infectious process. 2. Lymph nodes diffusely throughout the mesentery demonstrate surrounding edema and targetoid pattern. Differential diagnosis includes infectious/inflammatory process or lymphoma. 3. Epiploic appendagitis adjacent to the descending colon. 4. Diffuse fatty infiltration of the liver. Electronically Signed   By: Burman Nieves M.D.   On: 10/25/2017 22:36    Assessment and plan- Patient is a 46 y.o. male referred for liver lesions and mesenteric adenopathy.  I have personally reviewed patient's CT abdomen and I have also discussed the findings with Dr. Fredia Sorrow in radiology.  There was inflammatory stranding noted along the mesenteric lymph nodes.  There was also evidence of inflammatory stranding along the descending colon suggestive of diverticulitis and therefore the mesenteric adenopathy could very well be reactive.  As far as the liver lesions go-they are not characteristic for metastatic lesions as he did not appear hypervascular.  I have discussed all these findings with the patient.  At this time I will proceed with an MRI of the abdomen with and without contrast liver mass protocol to assess these liver lesions better.  I will also obtain CBC with differential, CMP, HIV hepatitis B and hepatitis C testing as well as ferritin and iron studies today.    I will see the patient back in 2 weeks time to discuss the results of his blood work and MRI and further management at that time   Thank you for this  kind referral and the opportunity to participate in the care of this patient   Visit Diagnosis 1. Liver lesion   2. Abnormal CT of the abdomen     Dr. Owens Shark, MD, MPH [facility] Pager- [phone] 10/30/2017 3:14 PM

## 2017-10-30 NOTE — Progress Notes (Signed)
Patient here for initial consult. He went to the ED last Wednesday after having left sided abdominal pain x 1 day. He took advil PM Tuesday night and slept well, but on Wednesday while he was at work, the pain worsened. He denies having any pain at this time. He states that he had an incredibly full and bloated feeling just prior to going to to the ED. That feeling subsided over the weekend.

## 2017-10-31 ENCOUNTER — Inpatient Hospital Stay: Payer: Medicaid Other | Admitting: Oncology

## 2017-10-31 LAB — IRON AND TIBC
IRON: 49 ug/dL (ref 45–182)
Saturation Ratios: 15 % — ABNORMAL LOW (ref 17.9–39.5)
TIBC: 324 ug/dL (ref 250–450)
UIBC: 275 ug/dL

## 2017-10-31 LAB — HIV ANTIBODY (ROUTINE TESTING W REFLEX): HIV Screen 4th Generation wRfx: NONREACTIVE

## 2017-10-31 LAB — HEPATITIS C ANTIBODY: HCV Ab: <0.1 s/co ratio (ref 0.0–0.9)

## 2017-10-31 LAB — HEPATITIS B SURFACE ANTIBODY, QUANTITATIVE: Hepatitis B-Post: <3.1 m[IU]/mL — ABNORMAL LOW (ref >9.9)

## 2017-10-31 LAB — HEPATITIS B SURFACE ANTIGEN: Hepatitis B Surface Ag: NEGATIVE

## 2017-10-31 LAB — FERRITIN: FERRITIN: 162 ng/mL (ref 24–336)

## 2017-10-31 LAB — HEPATITIS B CORE ANTIBODY, TOTAL: HEP B C TOTAL AB: NEGATIVE

## 2017-11-06 ENCOUNTER — Ambulatory Visit
Admission: RE | Admit: 2017-11-06 | Discharge: 2017-11-06 | Disposition: A | Discharge status: Home / Self Care | Payer: Medicaid Other | Source: Ambulatory Visit | Attending: Oncology | Admitting: Oncology

## 2017-11-06 DIAGNOSIS — R935 Abnormal findings on diagnostic imaging of other abdominal regions, including retroperitoneum: Secondary | ICD-10-CM | POA: Insufficient documentation

## 2017-11-06 DIAGNOSIS — K760 Fatty (change of) liver, not elsewhere classified: Secondary | ICD-10-CM | POA: Diagnosis not present

## 2017-11-06 DIAGNOSIS — K7689 Other specified diseases of liver: Secondary | ICD-10-CM | POA: Insufficient documentation

## 2017-11-06 MED ORDER — GADOBENATE DIMEGLUMINE 529 MG/ML IV SOLN
20.0000 mL | Freq: Once | INTRAVENOUS | Status: AC | PRN
  Administered 2017-11-06: 20 mL via INTRAVENOUS

## 2017-11-13 ENCOUNTER — Ambulatory Visit: Payer: Self-pay | Admitting: Oncology

## 2017-11-14 ENCOUNTER — Telehealth: Payer: Self-pay | Admitting: Oncology

## 2017-11-14 ENCOUNTER — Inpatient Hospital Stay: Payer: Medicaid Other | Admitting: Oncology

## 2017-11-14 NOTE — Telephone Encounter (Signed)
Appt rschd per Inclement weather.  Appt ltr mailed.

## 2017-11-20 NOTE — Progress Notes (Signed)
Hematology/Oncology Consult note [facility]  [phone] [phone]) [phone]  Patient Care Team: Patient, No Pcp Per as PCP - General (General Practice)   Name of the patient: Theodore Morton  [id number]  1971/03/24   Date of visit: 11/20/17  Diagnosis- liver cyst. Fatty liver. Mesenteric adenopathy  Chief complaint/ Reason for visit- discuss results of MRI abdomen  Heme/Onc history: patient is a 46 year old male who presented to the ER on 10/25/2017 with symptoms of left lower quadrant abdominal pain.  He underwent CT abdomen which showed low-attenuation lesions in the liver.  Largest one was 1.6 cm in diameter.  Lesions could represent cyst but due to poor definition cholangitis or fungal infection, metastases or lymphoma could have this appearance.  Patient also noted to have lymph nodes diffusely throughout the mesentery with surrounding edema.  Differential diagnosis includes infectious/inflammatory process or lymphoma.  Epiploic appendagitis adjacent to the descending colon.  CBC was normal except for a mild anemia of 12.6/38.6.  CMP revealed elevated creatinine of 1.42 which is stable as compared to 3 months prior when it was 1.34.  Currently patient reports that his abdominal pain is much better.  He also had symptoms of bloating and belching which have improved.  He denies any fever..  Denies any bleeding in his stool or urine.  He does use occasional NSAIDs for his back pain which has now improved.  He was out of work for the first 10 months of the year due to back injury and because of that he gained close to 20 pounds.   Interval history- doing well. Denies any abdominal pain or other complaints  ECOG PS- 0 Pain scale- 0   Review of systems- Review of Systems  Constitutional: Negative for chills, fever, malaise/fatigue and weight loss.  HENT: Negative for congestion, ear discharge and nosebleeds.   Eyes: Negative for blurred vision.    Respiratory: Negative for cough, hemoptysis, sputum production, shortness of breath and wheezing.   Cardiovascular: Negative for chest pain, palpitations, orthopnea and claudication.  Gastrointestinal: Negative for abdominal pain, blood in stool, constipation, diarrhea, heartburn, melena, nausea and vomiting.  Genitourinary: Negative for dysuria, flank pain, frequency, hematuria and urgency.  Musculoskeletal: Negative for back pain, joint pain and myalgias.  Skin: Negative for rash.  Neurological: Negative for dizziness, tingling, focal weakness, seizures, weakness and headaches.  Endo/Heme/Allergies: Does not bruise/bleed easily.  Psychiatric/Behavioral: Negative for depression and suicidal ideas. The patient does not have insomnia.       No Known Allergies   Past Medical History:  Diagnosis Date  . Anemia   . Anxiety   . Hypertension      Past Surgical History:  Procedure Laterality Date  . NO PAST SURGERIES      Social History   Socioeconomic History  . Marital status: Married    Spouse name: Not on file  . Number of children: 2  . Years of education: Not on file  . Highest education level: Not on file  Social Needs  . Financial resource strain: Not on file  . Food insecurity - worry: Not on file  . Food insecurity - inability: Not on file  . Transportation needs - medical: Not on file  . Transportation needs - non-medical: Not on file  Occupational History  . Occupation: RETAIL  Tobacco Use  . Smoking status: Never Smoker  . Smokeless tobacco: Never Used  Substance and Sexual Activity  . Alcohol use: Yes    Alcohol/week: 0.6 -  1.2 oz    Types: 1 - 2 Standard drinks or equivalent per week  . Drug use: No  . Sexual activity: Yes    Partners: Female  Other Topics Concern  . Not on file  Social History Narrative  . Not on file    Family History  Problem Relation Age of Onset  . Breast cancer Mother   . Diabetes Father      Current Outpatient  Medications:  .  amLODipine (NORVASC) 10 MG tablet, Take 1 tablet (10 mg total) by mouth daily., Disp: 90 tablet, Rfl: 1 .  hydrochlorothiazide (MICROZIDE) 12.5 MG capsule, Take 1 capsule (12.5 mg total) by mouth daily., Disp: 90 capsule, Rfl: 3  Physical exam:  Vitals:   11/21/17 1002  BP: (!) 149/108  Pulse: (!) 105  Resp: 14  Temp: (!) 96.8 F (36 C)  TempSrc: Tympanic  Weight: 233 lb (105.7 kg)   Physical Exam  Constitutional: He is oriented to person, place, and time and well-developed, well-nourished, and in no distress.  HENT:  Head: Normocephalic and atraumatic.  Eyes: EOM are normal. Pupils are equal, round, and reactive to light.  Neck: Normal range of motion.  Cardiovascular: Normal rate, regular rhythm and normal heart sounds.  Pulmonary/Chest: Effort normal and breath sounds normal.  Abdominal: Soft. Bowel sounds are normal.  Neurological: He is alert and oriented to person, place, and time.  Skin: Skin is warm and dry.     CMP Latest Ref Rng & Units 10/30/2017  Glucose 65 - 99 mg/dL 409(W118(H)  BUN 6 - 20 mg/dL 15  Creatinine 1.190.61 - 1.471.24 mg/dL 8.29(F1.31(H)  Sodium 621135 - 308145 mmol/L 133(L)  Potassium 3.5 - 5.1 mmol/L 4.2  Chloride 101 - 111 mmol/L 101  CO2 22 - 32 mmol/L 27  Calcium 8.9 - 10.3 mg/dL 9.1  Total Protein 6.5 - 8.1 g/dL 6.5(H8.5(H)  Total Bilirubin 0.3 - 1.2 mg/dL 0.5  Alkaline Phos 38 - 126 U/L 61  AST 15 - 41 U/L 26  ALT 17 - 63 U/L 33   CBC Latest Ref Rng & Units 10/30/2017  WBC 3.8 - 10.6 K/uL 5.1  Hemoglobin 13.0 - 18.0 g/dL 12.4(L)  Hematocrit 40.0 - 52.0 % 37.3(L)  Platelets 150 - 440 K/uL 358    No images are attached to the encounter.  Mr Abdomen W Wo Contrast  Result Date: 11/06/2017 CLINICAL DATA:  Multiple hepatic lesions on CT EXAM: MRI ABDOMEN WITHOUT AND WITH CONTRAST TECHNIQUE: Multiplanar multisequence MR imaging of the abdomen was performed both before and after the administration of intravenous contrast. CONTRAST:  20mL MULTIHANCE  GADOBENATE DIMEGLUMINE 529 MG/ML IV SOLN COMPARISON:  CT abdomen/ pelvis dated 10/25/2017 FINDINGS: Lower chest: Lung bases are clear. Hepatobiliary: Numerous hepatic cysts and/or biliary hamartomas, measuring up to 2.4 cm in segment 8 (series 4/image 8). No suspicious/enhancing hepatic lesions. Underlying moderate to severe geographic hepatic steatosis. Gallbladder is within normal limits. No intrahepatic or extrahepatic duct dilatation. Pancreas:  Within normal limits. Spleen:  Within normal limits. Adrenals/Urinary Tract:  Adrenal glands within normal limits. Kidneys are within normal limits.  No hydronephrosis. Stomach/Bowel: Stomach is within normal limits. Visualized bowel is grossly unremarkable. Vascular/Lymphatic:  No evidence of abdominal aortic aneurysm. Small mesenteric nodes, incompletely visualized on coronal imaging, better visualized on prior CT abdomen/pelvis. Other:  No abdominal ascites. Musculoskeletal: No focal osseous lesions. IMPRESSION: Numerous hepatic cysts and/or biliary hamartomas, measuring up to 2.4 cm in segment 8, benign. Underlying moderate to severe geographic hepatic  steatosis. Electronically Signed   By: Charline Bills M.D.   On: 11/06/2017 08:56   Ct Abdomen Pelvis W Contrast  Result Date: 10/25/2017 CLINICAL DATA:  Left lower abdominal pain and bloating. EXAM: CT ABDOMEN AND PELVIS WITH CONTRAST TECHNIQUE: Multidetector CT imaging of the abdomen and pelvis was performed using the standard protocol following bolus administration of intravenous contrast. CONTRAST:  ISOVUE-300 IOPAMIDOL (ISOVUE-300) INJECTION 61% COMPARISON:  None. FINDINGS: Lower chest: Lung bases are clear. Hepatobiliary: Diffuse fatty infiltration of the liver. Poorly defined circumscribed low-attenuation lesions are demonstrated throughout the liver. Largest is centrally at the dome of the right liver and measures 1.6 cm diameter. Lesions could represent cyst but due to poor definition,  cholangitis or fungal infection, metastasis or lymphoma could also have this appearance. Follow-up with elective MRI is recommended for lesion characterization. Gallbladder is contracted, likely physiologic. No stones or inflammatory changes identified. No bile duct dilatation. Pancreas: Unremarkable. No pancreatic ductal dilatation or surrounding inflammatory changes. Spleen: Normal in size without focal abnormality. Adrenals/Urinary Tract: No adrenal gland nodules. Kidneys are symmetrical. Homogeneous nephrograms. No hydronephrosis or hydroureter. Bladder wall is diffusely thickened. This could be due to under distention or cystitis. Correlation with urinalysis is recommended. Stomach/Bowel: Stomach, small bowel, and colon are mostly decompressed. Scattered stool in the colon. No discrete wall thickening identified. Focal fat lobule adjacent to the mid descending colon with surrounding inflammation consistent with epiploic appendagitis. Appendix is normal. Vascular/Lymphatic: Normal caliber abdominal aorta. No aneurysm or dissection. Inferior vena cava is unremarkable. Throughout the mesentery, there are focal solid nodular structures with surrounding edema representing a target like appearance. These are likely lymph nodes. Changes could be due to a diffuse lymphadenitis or other infectious/inflammatory process or lymphoma. No pathologic enlargement of these nodes. Reproductive: Prostate gland is enlarged, measuring 4.5 cm diameter. Other: No free air or free fluid in the abdomen. Abdominal wall musculature appears intact. Musculoskeletal: No acute or significant osseous findings. IMPRESSION: 1. Somewhat poorly defined low-attenuation lesions throughout the liver, largest measuring 1.6 cm diameter. Differential diagnosis includes cysts, metastasis, lymphoma, or infectious process. 2. Lymph nodes diffusely throughout the mesentery demonstrate surrounding edema and targetoid pattern. Differential diagnosis includes  infectious/inflammatory process or lymphoma. 3. Epiploic appendagitis adjacent to the descending colon. 4. Diffuse fatty infiltration of the liver. Electronically Signed   By: Burman Nieves M.D.   On: 10/25/2017 22:36     Assessment and plan- Patient is a 46 y.o. male referred from ER for abnormal CT scan  I personally reviewed and discussed the results of the MRI liver with the patient in detail.  Liver lesion seen on CT abdomen found to be hepatic cysts measuring up to 2.4 cm.  There were no suspicious/enhancing hepatic lesions.  There was evidence of moderate to severe hepatic steatosis.  At this point there is no convincing evidence of malignancy.  I will refer him to GI for his moderate to severe hepatic steatosis.  Baseline LFTs are normal.  HIV hepatitis B and hepatitis C testing was negative.    He does have baseline CKD which needs to be followed by pcp. Probably due to HTN  Mesenteric adenopathy that was seen on prior CT abdomen was reviewed with radiology and this appears reactive in the setting of diverticulitis which is clinically improving.  These lymph nodes did not appear pathologic and did not require follow-up at this time.  Patient noted to have B12 and folate deficiency back in Jan 2018. Will recheck today. We will  call patient if B12 and folate is still low and inform [facility]cp at [facility] as well. They can continue to follow that up and set him up for B12 injections   Visit Diagnosis 1. Fatty liver   2. Liver cyst   3. B12 deficiency   4. Folate deficiency      Dr. Owens SharkArchana Rao, MD, MPH [facility] at [facility] Pager- [phone] 11/21/2017 10:17 AM

## 2017-11-21 ENCOUNTER — Encounter: Payer: Self-pay | Admitting: Oncology

## 2017-11-21 ENCOUNTER — Inpatient Hospital Stay: Payer: Medicaid Other

## 2017-11-21 ENCOUNTER — Telehealth: Payer: Self-pay | Admitting: Oncology

## 2017-11-21 ENCOUNTER — Inpatient Hospital Stay: Payer: Medicaid Other | Attending: Oncology | Admitting: Oncology

## 2017-11-21 VITALS — BP 149/108 | HR 105 | Temp 96.8°F | Resp 14 | Wt 233.0 lb

## 2017-11-21 DIAGNOSIS — E538 Deficiency of other specified B group vitamins: Secondary | ICD-10-CM | POA: Diagnosis not present

## 2017-11-21 DIAGNOSIS — I129 Hypertensive chronic kidney disease with stage 1 through stage 4 chronic kidney disease, or unspecified chronic kidney disease: Secondary | ICD-10-CM

## 2017-11-21 DIAGNOSIS — Z79899 Other long term (current) drug therapy: Secondary | ICD-10-CM

## 2017-11-21 DIAGNOSIS — D649 Anemia, unspecified: Secondary | ICD-10-CM

## 2017-11-21 DIAGNOSIS — K7689 Other specified diseases of liver: Secondary | ICD-10-CM

## 2017-11-21 DIAGNOSIS — N189 Chronic kidney disease, unspecified: Secondary | ICD-10-CM | POA: Diagnosis not present

## 2017-11-21 DIAGNOSIS — R590 Localized enlarged lymph nodes: Secondary | ICD-10-CM | POA: Diagnosis not present

## 2017-11-21 DIAGNOSIS — K760 Fatty (change of) liver, not elsewhere classified: Secondary | ICD-10-CM

## 2017-11-21 DIAGNOSIS — F419 Anxiety disorder, unspecified: Secondary | ICD-10-CM

## 2017-11-21 DIAGNOSIS — M549 Dorsalgia, unspecified: Secondary | ICD-10-CM

## 2017-11-21 LAB — FOLATE: Folate: 6.8 ng/mL (ref >5.9)

## 2017-11-21 LAB — VITAMIN B12: Vitamin B-12: 190 pg/mL (ref 180–914)

## 2017-11-21 NOTE — Telephone Encounter (Signed)
Lab addon for today, per Sherry/verbal.  No Follow up appt at this time, per Sherry/verbal.

## 2017-11-22 ENCOUNTER — Telehealth: Payer: Self-pay | Admitting: *Deleted

## 2017-11-22 NOTE — Telephone Encounter (Signed)
Left message on patient's VM that B12 levels are still low and he needs to take Vitamin B12 daily, or to get monthly B12 injections, and to call me back to let me know that he received the message.      dhs

## 2017-11-30 ENCOUNTER — Other Ambulatory Visit: Payer: Self-pay

## 2017-12-01 ENCOUNTER — Ambulatory Visit (INDEPENDENT_AMBULATORY_CARE_PROVIDER_SITE_OTHER): Payer: Medicaid Other | Admitting: Gastroenterology

## 2017-12-01 ENCOUNTER — Encounter: Payer: Self-pay | Admitting: Gastroenterology

## 2017-12-01 ENCOUNTER — Other Ambulatory Visit: Payer: Self-pay

## 2017-12-01 ENCOUNTER — Telehealth: Payer: Self-pay

## 2017-12-01 VITALS — BP 164/112 | HR 89 | Ht 68.5 in | Wt 237.2 lb

## 2017-12-01 DIAGNOSIS — K6389 Other specified diseases of intestine: Secondary | ICD-10-CM

## 2017-12-01 DIAGNOSIS — K760 Fatty (change of) liver, not elsewhere classified: Secondary | ICD-10-CM | POA: Diagnosis not present

## 2017-12-01 DIAGNOSIS — K7689 Other specified diseases of liver: Secondary | ICD-10-CM | POA: Diagnosis not present

## 2017-12-01 DIAGNOSIS — K529 Noninfective gastroenteritis and colitis, unspecified: Secondary | ICD-10-CM

## 2017-12-01 NOTE — Telephone Encounter (Signed)
Left message for pt to contact office in regards to his primary care provider. [place] by [facility] who is internal med. And has been handling his blood pressure issues. Who will he see? He also a pt a [facility] (which are also connected with [facility].

## 2017-12-01 NOTE — Telephone Encounter (Signed)
-----   Message from Pasty SpillersVarnita B Tahiliani, MD sent at 12/01/2017 11:34 AM EST ----- Eunice Blaseebbie,   Can you please make sure the patient's note from today goes to his primary care provider.  They need to give him hepatitis a and B vaccines and follow up with him regarding his hypertension.  He states he [address] but he did not remember their name.

## 2017-12-01 NOTE — Patient Instructions (Signed)
F/u 2 months  F/u with PCP R/T HTN Liver and weight loss handout given

## 2017-12-01 NOTE — Progress Notes (Signed)
Vonda Antigua [address]  [place]  [place], [place] [address]  Main: [phone]  Fax: [phone]   Gastroenterology Consultation  Referring Provider:     Sindy Guadeloupe, MD Primary Care Physician: Previously Dr. Lacinda Axon, recently started seeing new PCP on [facility] that patient does not know the name of. Primary Gastroenterologist:  Dr. Vonda Antigua Reason for Consultation:    Fatty liver        HPI:   Theodore Morton is a 46 y.o. y/o male referred for consultation & management  by Dr. Patient, No Pcp Per.  Patient was recently seen in the ER for left lower quadrant abdominal pain 1 month ago in November 2018 (lasted for 2-3 days, cramping, 5/10, nonradiating, not associated with nausea vomiting or fever chills).  CT scan at that time showed epiploic appendicitis in the descending colon.  He was managed conservatively, without antibiotics and pain resolved in 2-3 days.  Pain has not reoccurred since then and he has no previous history of the same.  He denies any nausea vomiting, abdominal pain, dysphagia, weight loss.  Reports occasional heartburn once a month.  Besides epiploic appendage otitis in the descending colon, CT also reported diffuse fatty infiltration of the liver, somewhat poorly defined low-attenuation lesions throughout the liver, largest measuring 1.6 cm diameter.  Lymph nodes diffusely throughout the mesentery demonstrating surrounding edema and targetoid pattern.  He was subsequently referred to oncology due to the above findings.  They ordered a MRI that was done on December 3 and showed numerous hepatic cysts and/or biliary hamartomas measuring up to 2.4 cm in segment 8, benign.  Moderate to severe geographic hepatic steatosis was also reported.  No suspicious, enhancing hepatic lesions were seen.  Gallbladder and bile ducts and pancreas were reported to be normal.  Last seen by oncology, Dr. Janese Banks, on December 18 who states "at this point there is no  convincing evidence of malignancy) and referred him to GI for hepatic steatosis.  Patient denies heavy Tylenol use.  Denies previous history of heavy alcohol use.  Drinks 1-2 drinks once or twice a month at most.  Denies any drug use or smoking.  Denies any family history of colon cancer, liver cancer or other GI malignancies.  Reports that mother had breast cancer.  Denies previous history of hepatitis, abdominal distention or lower extremity swelling.   Nov 2018 labs showed Hepatitis C antibody to be negative.  Hep B surface antigen negative.  Hepatology core antibody negative.  Hep B surface antibody less than 3.  Normal liver enzymes.  Normal platelets  Was seen by [facility] our gastroenterology in February 2018 for anemia.  They report states that his anemia is chronic normocytic anemia with a normal MCV and question pernicious anemia.  They recommended a colonoscopy at the time but patient was not able to do it as he was in between jobs.  No previous colonoscopy or EGD.  Reports normal bowel movements every day, soft, no diarrhea or constipation.  Reports one episode of bright blood per rectum 3 days ago.  Bright red blood was only on the toilet paper.  No blood streaks in stool.  Denies feeling any skin tags or hemorrhoids when he wipes.  This episode has not occurred prior to or after this.  Past Medical History:  Diagnosis Date  . Anemia   . Anxiety   . Hypertension     Past Surgical History:  Procedure Laterality Date  . NO PAST SURGERIES  Prior to Admission medications   Medication Sig Start Date End Date Taking? Authorizing Provider  amLODipine (NORVASC) 10 MG tablet Take 1 tablet (10 mg total) by mouth daily. 01/24/17  Yes Cook, Jayce G, DO  cyclobenzaprine (FLEXERIL) 10 MG tablet Take by mouth. 12/23/16   [provider]  ferrous sulfate 325 (65 FE) MG tablet Take by mouth. 12/12/16   [provider]  folic acid (FOLVITE) 1 MG tablet Take by mouth. 12/12/16    [provider]  hydrochlorothiazide (MICROZIDE) 12.5 MG capsule Take 1 capsule (12.5 mg total) by mouth daily. Patient not taking: Reported on 12/01/2017 01/12/17   Coral Spikes, DO  HYDROcodone-acetaminophen (NORCO/VICODIN) 5-325 MG tablet Take by mouth. 12/19/16   [provider]  meloxicam (MOBIC) 15 MG tablet Take by mouth. 12/23/16   [provider]    Family History  Problem Relation Age of Onset  . Breast cancer Mother   . Diabetes Father      Social History   Tobacco Use  . Smoking status: Never Smoker  . Smokeless tobacco: Never Used  Substance Use Topics  . Alcohol use: Yes    Alcohol/week: 0.6 - 1.2 oz    Types: 1 - 2 Standard drinks or equivalent per week  . Drug use: No    Allergies as of 12/01/2017  . (No Known Allergies)    Review of Systems:    All systems reviewed and negative except where noted in HPI.   Physical Exam:  BP (!) 164/112   Pulse 89   Ht 5' 8.5" (1.74 m)   Wt 237 lb 3.2 oz (107.6 kg)   BMI 35.54 kg/m  No LMP for male patient. Psych:  Alert and cooperative. Normal mood and affect. General:   Alert,  Well-developed, well-nourished, pleasant and cooperative in NAD Head:  Normocephalic and atraumatic. Eyes:  Sclera clear, no icterus.   Conjunctiva pink. Ears:  Normal auditory acuity. Nose:  No deformity, discharge, or lesions. Mouth:  No deformity or lesions,oropharynx pink & moist. Neck:  Supple; no masses or thyromegaly. Lungs:  Respirations even and unlabored.  Clear throughout to auscultation.   No wheezes, crackles, or rhonchi. No acute distress. Heart:  Regular rate and rhythm; no murmurs, clicks, rubs, or gallops. Abdomen:  Normal bowel sounds.  No bruits.  Soft, non-tender and non-distended without masses, hepatosplenomegaly or hernias noted.  No guarding or rebound tenderness.    Msk:  Symmetrical without gross deformities. Good, equal movement & strength bilaterally. Pulses:  Normal pulses  noted. Extremities:  No clubbing or edema.  No cyanosis. Neurologic:  Alert and oriented x3;  grossly normal neurologically. Skin:  Intact without significant lesions or rashes. No jaundice. Lymph Nodes:  No significant cervical adenopathy. Psych:  Alert and cooperative. Normal mood and affect.   Labs: CBC    Component Value Date/Time   WBC 5.1 10/30/2017 1504   RBC 4.29 (L) 10/30/2017 1504   HGB 12.4 (L) 10/30/2017 1504   HGB 12.0 (L) 12/22/2014 0350   HCT 37.3 (L) 10/30/2017 1504   HCT 37.4 (L) 12/22/2014 0350   PLT 358 10/30/2017 1504   PLT 333 12/22/2014 0350   MCV 86.9 10/30/2017 1504   MCV 88 12/22/2014 0350   MCH 28.9 10/30/2017 1504   MCHC 33.2 10/30/2017 1504   RDW 12.4 10/30/2017 1504   RDW 12.4 12/22/2014 0350   LYMPHSABS 1.6 10/30/2017 1504   MONOABS 0.3 10/30/2017 1504   EOSABS 0.1 10/30/2017 1504  BASOSABS 0.0 10/30/2017 1504   CMP     Component Value Date/Time   NA 133 (L) 10/30/2017 1504   NA 140 12/22/2014 0350   K 4.2 10/30/2017 1504   K 3.9 12/22/2014 0350   CL 101 10/30/2017 1504   CL 106 12/22/2014 0350   CO2 27 10/30/2017 1504   CO2 27 12/22/2014 0350   GLUCOSE 118 (H) 10/30/2017 1504   GLUCOSE 97 12/22/2014 0350   BUN 15 10/30/2017 1504   BUN 15 12/22/2014 0350   CREATININE 1.31 (H) 10/30/2017 1504   CREATININE 1.45 (H) 12/22/2014 0350   CALCIUM 9.1 10/30/2017 1504   CALCIUM 8.4 (L) 12/22/2014 0350   PROT 8.5 (H) 10/30/2017 1504   ALBUMIN 4.4 10/30/2017 1504   AST 26 10/30/2017 1504   ALT 33 10/30/2017 1504   ALKPHOS 61 10/30/2017 1504   BILITOT 0.5 10/30/2017 1504   GFRNONAA >60 10/30/2017 1504   GFRNONAA 56 (L) 12/22/2014 0350   GFRAA >60 10/30/2017 1504   GFRAA >60 12/22/2014 0350   C-Reactive Protein  No results found for: CRP Iron/TIBC/Ferritin/ %Sat    Component Value Date/Time   IRON 49 10/30/2017 1504   TIBC 324 10/30/2017 1504   FERRITIN 162 10/30/2017 1504   IRONPCTSAT 15 (L) 10/30/2017 1504   IRONPCTSAT 14 (L)  12/08/2016 1508   Imaging Studies: Mr Abdomen W Wo Contrast  Result Date: 11/06/2017 CLINICAL DATA:  Multiple hepatic lesions on CT EXAM: MRI ABDOMEN WITHOUT AND WITH CONTRAST TECHNIQUE: Multiplanar multisequence MR imaging of the abdomen was performed both before and after the administration of intravenous contrast. CONTRAST:  63m MULTIHANCE GADOBENATE DIMEGLUMINE 529 MG/ML IV SOLN COMPARISON:  CT abdomen/ pelvis dated 10/25/2017 FINDINGS: Lower chest: Lung bases are clear. Hepatobiliary: Numerous hepatic cysts and/or biliary hamartomas, measuring up to 2.4 cm in segment 8 (series 4/image 8). No suspicious/enhancing hepatic lesions. Underlying moderate to severe geographic hepatic steatosis. Gallbladder is within normal limits. No intrahepatic or extrahepatic duct dilatation. Pancreas:  Within normal limits. Spleen:  Within normal limits. Adrenals/Urinary Tract:  Adrenal glands within normal limits. Kidneys are within normal limits.  No hydronephrosis. Stomach/Bowel: Stomach is within normal limits. Visualized bowel is grossly unremarkable. Vascular/Lymphatic:  No evidence of abdominal aortic aneurysm. Small mesenteric nodes, incompletely visualized on coronal imaging, better visualized on prior CT abdomen/pelvis. Other:  No abdominal ascites. Musculoskeletal: No focal osseous lesions. IMPRESSION: Numerous hepatic cysts and/or biliary hamartomas, measuring up to 2.4 cm in segment 8, benign. Underlying moderate to severe geographic hepatic steatosis. Electronically Signed   By: SJulian HyM.D.   On: 11/06/2017 08:56    Assessment and Plan:   RDaymian Morton a 46y.o. y/o male has been referred for fatty liver  Fatty liver Patient reports weight gain over the last 6 months as he was out of work and was not active as he is usually.  Recorded weights showed the patient is 237 pounds today and was 205 pounds in May 2018.  BMI is 35. We discussed the significance of fatty liver and the importance of  weight loss.  I have encouraged him to lose 1-2 pounds every week with a goal of 7-10% weight loss.  He has met with a nutritionist and plans on being more active and eating better to lose weight.  He does not have any signs of cirrhosis at this time.  Liver enzymes are normal. We extensively discussed avoiding hepatotoxic drugs including alcohol. I have asked him to follow-up with his primary care  doctor to obtain hep B and hep A immunization.  This is not available in our clinic at this time.  Hepatic cyst He is seeing oncologist for his hepatic cysts and mesenteric lymphadenopathy which was not thought to be due to malignancy. As indicated in the MRI report, the cysts appear to be benign.  They are asymptomatic at this time.  Can continue to follow this with further imaging in the future.  MRI reports them to have no suspicious or enhancing features.  No septations are reported.  Normal liver enzymes are also reassuring. These are likely simple cysts and, we can consider ultrasound for follow-up in the future in 2-3 months.  -Bright red blood per rectum, history of epiploic appendagitis New American Cancer Society guidelines recommend screening colonoscopy starting at 48 years of age.  This was discussed with the patient. In addition, screening colonoscopy will allow [place] to evaluate for any underlying hemorrhoids as cause of his bright red blood per rectum.  This was an isolated episode and spontaneously resolved and clinical history is consistent with the episode likely being related to hemorrhoids.  No alarm features present. In addition epiploic appendagitis symptoms have resolved at this time, and a colonoscopy would allow [place] to evaluate for any underlying diverticuli as well.  Diverticulitis was not present on his CT scan report.  However, epiploic appendicitis can present as diverticulitis symptomatically and on CT scan. Given the recent epiploic appendagitis, would be safer to do any  endoscopic procedures in 2-3 months from the episode.  We would just like to wait another 2 months prior to any colonoscopies. Patient agreeable with the plan If symptoms reoccur, patient asked to call us  We will see him in clinic in 2 months to assess for symptom improvement, and can order colonoscopy at the time if patient is continuing to improve  No alarm features present at this time to indicate urgent endoscopy.  Patient asked to follow-up with PCP regarding his chronic medical conditions including hypertension.  Patient did not take his blood pressure medication today.  Denies any chest pain, vision changes, headaches, he states he will go home and take his blood pressure medication today and recheck his blood pressure.  He will check his blood pressure every day for the next week and call his primary care doctor to make an sooner appointment if blood pressure remains uncontrolled or he develops any of the above symptoms.  He was asked to go to the ER if any of the above symptoms develop.  He verbalizes understanding.  Dr Vonda Antigua

## 2017-12-04 ENCOUNTER — Telehealth: Payer: Self-pay | Admitting: *Deleted

## 2017-12-04 NOTE — Telephone Encounter (Signed)
Left 2nd VM message for patient to call me back regarding his B12 level and taking PO Vitamin B12 daily.     dhs

## 2017-12-25 ENCOUNTER — Other Ambulatory Visit: Payer: Self-pay | Admitting: *Deleted

## 2017-12-25 DIAGNOSIS — R599 Enlarged lymph nodes, unspecified: Secondary | ICD-10-CM

## 2017-12-25 DIAGNOSIS — R591 Generalized enlarged lymph nodes: Secondary | ICD-10-CM

## 2017-12-26 ENCOUNTER — Telehealth: Payer: Self-pay | Admitting: *Deleted

## 2017-12-26 ENCOUNTER — Other Ambulatory Visit: Payer: Self-pay | Admitting: *Deleted

## 2017-12-26 NOTE — Telephone Encounter (Signed)
raoPer Dr. Assunta Gamblesao's request, spoke with patient via telephone and discussed taking PO vitamin B12. He agreed to go get Vitamin B 12 1000mcg from his local pharmacy and he will take it daily. Also informed him that Dr. Smith Robertao wants to do another scan to follow up of the lymphadenopathy and see him again the end of March. Patient was agreeable to this. Message sent to scheduling staff.      dhs

## 2018-01-08 ENCOUNTER — Telehealth: Payer: Self-pay | Admitting: Gastroenterology

## 2018-01-08 NOTE — Telephone Encounter (Signed)
LVM for patient to call and reschedule. °

## 2018-01-10 ENCOUNTER — Encounter: Payer: Self-pay | Admitting: Gastroenterology

## 2018-01-10 ENCOUNTER — Telehealth: Payer: Self-pay | Admitting: Gastroenterology

## 2018-01-10 NOTE — Telephone Encounter (Signed)
LVM for patient to call and reschedule. Letter sent °

## 2018-01-31 ENCOUNTER — Ambulatory Visit: Payer: Self-pay | Admitting: Gastroenterology

## 2018-02-06 ENCOUNTER — Encounter (INDEPENDENT_AMBULATORY_CARE_PROVIDER_SITE_OTHER): Payer: Self-pay

## 2018-02-06 ENCOUNTER — Other Ambulatory Visit: Payer: Self-pay

## 2018-02-06 ENCOUNTER — Ambulatory Visit: Payer: Medicaid Other | Admitting: Gastroenterology

## 2018-02-06 ENCOUNTER — Encounter: Payer: Self-pay | Admitting: Gastroenterology

## 2018-02-06 VITALS — BP 137/98 | HR 84 | Ht 68.0 in | Wt 233.0 lb

## 2018-02-06 DIAGNOSIS — K529 Noninfective gastroenteritis and colitis, unspecified: Secondary | ICD-10-CM

## 2018-02-06 DIAGNOSIS — K760 Fatty (change of) liver, not elsewhere classified: Secondary | ICD-10-CM

## 2018-02-06 DIAGNOSIS — K6389 Other specified diseases of intestine: Secondary | ICD-10-CM

## 2018-02-06 NOTE — Progress Notes (Signed)
[facility]. He has also gotten his Hep A & B vaccine.

## 2018-02-07 NOTE — Progress Notes (Signed)
Melodie Bouillon, MD [address]  [address]  [place], [place] [address]  Main: [phone]  Fax: [phone]   Primary Care Physician: Tommie Sams, DO  Primary Gastroenterologist:  Dr. Melodie Bouillon  Chief Complaint  Patient presents with  . Follow-up    fatty liver, rectal bleeding    HPI: Theodore Morton is a 47 y.o. male here for follow-up of fatty liver and rectum.  Initially seen on November 23, 2017.  At that time patient had a recent CT scan that showed epiploic appendagitis.  Patient had abdominal pain when the CT scan was done.  Abdominal pain has completely resolved since then.  Patient reports normal bowel movements.  Reports one episode of bright blood per rectum, 3-4 months ago.  No family history of colon cancer.  Current Outpatient Medications  Medication Sig Dispense Refill  . amLODipine (NORVASC) 10 MG tablet Take 1 tablet (10 mg total) by mouth daily. 90 tablet 1  . cyclobenzaprine (FLEXERIL) 10 MG tablet Take by mouth.    . ferrous sulfate 325 (65 FE) MG tablet Take by mouth.    . folic acid (FOLVITE) 1 MG tablet Take by mouth.    . hydrochlorothiazide (MICROZIDE) 12.5 MG capsule Take 1 capsule (12.5 mg total) by mouth daily. 90 capsule 3  . Phenyleph-Doxylamine-DM-APAP (TYLENOL COLD MULTI-SYMPTOM PO) Take by mouth as needed.    . vitamin B-12 (CYANOCOBALAMIN) 1000 MCG tablet Take 1,000 mcg by mouth daily.    Marland Kitchen HYDROcodone-acetaminophen (NORCO/VICODIN) 5-325 MG tablet Take by mouth.    . meloxicam (MOBIC) 15 MG tablet Take by mouth.     No current facility-administered medications for this visit.     Allergies as of 02/06/2018  . (No Known Allergies)    ROS:  General: Negative for anorexia, weight loss, fever, chills, fatigue, weakness. ENT: Negative for hoarseness, difficulty swallowing , nasal congestion. CV: Negative for chest pain, angina, palpitations, dyspnea on exertion, peripheral edema.  Respiratory: Negative for dyspnea at rest,  dyspnea on exertion, cough, sputum, wheezing.  GI: See history of present illness. GU:  Negative for dysuria, hematuria, urinary incontinence, urinary frequency, nocturnal urination.  Endo: Negative for unusual weight change.    Physical Examination:   BP (!) 137/98 Comment: 12 noon  Pulse 84   Ht 5\' 8"  (1.727 m)   Wt 233 lb (105.7 kg)   BMI 35.43 kg/m   General: Well-nourished, well-developed in no acute distress.  Eyes: No icterus. Conjunctivae pink. Mouth: Oropharyngeal mucosa moist and pink , no lesions erythema or exudate. Neck: Supple, Trachea midline Abdomen: Bowel sounds are normal, nontender, nondistended, no hepatosplenomegaly or masses, no abdominal bruits or hernia , no rebound or guarding.   Extremities: No lower extremity edema. No clubbing or deformities. Neuro: Alert and oriented x 3.  Grossly intact. Skin: Warm and dry, no jaundice.   Psych: Alert and cooperative, normal mood and affect.   Labs: CMP     Component Value Date/Time   NA 133 (L) 10/30/2017 1504   NA 140 12/22/2014 0350   K 4.2 10/30/2017 1504   K 3.9 12/22/2014 0350   CL 101 10/30/2017 1504   CL 106 12/22/2014 0350   CO2 27 10/30/2017 1504   CO2 27 12/22/2014 0350   GLUCOSE 118 (H) 10/30/2017 1504   GLUCOSE 97 12/22/2014 0350   BUN 15 10/30/2017 1504   BUN 15 12/22/2014 0350   CREATININE 1.31 (H) 10/30/2017 1504   CREATININE 1.45 (H) 12/22/2014 0350  CALCIUM 9.1 10/30/2017 1504   CALCIUM 8.4 (L) 12/22/2014 0350   PROT 8.5 (H) 10/30/2017 1504   ALBUMIN 4.4 10/30/2017 1504   AST 26 10/30/2017 1504   ALT 33 10/30/2017 1504   ALKPHOS 61 10/30/2017 1504   BILITOT 0.5 10/30/2017 1504   GFRNONAA >60 10/30/2017 1504   GFRNONAA 56 (L) 12/22/2014 0350   GFRAA >60 10/30/2017 1504   GFRAA >60 12/22/2014 0350   Lab Results  Component Value Date   WBC 5.1 10/30/2017   HGB 12.4 (L) 10/30/2017   HCT 37.3 (L) 10/30/2017   MCV 86.9 10/30/2017   PLT 358 10/30/2017    Imaging Studies: No  results found.  Assessment and Plan:   Theodore Morton is a 47 y.o. y/o male with history of epiploic appendagitis, intermittent bright red blood per rectum, fatty liver here for follow-up  As per American Cancer Society guidelines, colonoscopy for screening is indicated after 49 years of age This was discussed with the patient A colonoscopy would also allow for evaluation of his colon to rule out any underlying lesions that may be masking as epiploic appendagitis It would also allow us to evaluate for the etiology of his isolated, intermittent bright red blood per rectum Patient is agreeable for the exam  As far as his fatty liver, he was encouraged to lose weight No functional abnormalities of his liver enzymes present Weight loss exercise and good diet encouraged  Hepatic cysts were seen in the liver on his oncology workup for mesenteric lymphadenopathy These appear to be benign in nature Follow-up can be done with ultrasound.  This was discussed with the patient Patient encouraged to obtain hepatitis a and B vaccines from his primary care provider.  I have discussed alternative options, risks & benefits,  which include, but are not limited to, bleeding, infection, perforation,respiratory complication & drug reaction.  The patient agrees with this plan & written consent will be obtained.     Dr Melodie BouillonVarnita Mcguire Gasparyan

## 2018-02-15 ENCOUNTER — Telehealth: Payer: Self-pay

## 2018-02-15 ENCOUNTER — Other Ambulatory Visit: Payer: Self-pay

## 2018-02-15 DIAGNOSIS — K7689 Other specified diseases of liver: Secondary | ICD-10-CM

## 2018-02-15 NOTE — Telephone Encounter (Signed)
Left message to contact office that one appt has been scheduled and we need to schedule the other procedure.

## 2018-02-22 ENCOUNTER — Ambulatory Visit: Payer: Medicaid Other

## 2018-02-27 ENCOUNTER — Telehealth: Payer: Self-pay | Admitting: *Deleted

## 2018-02-27 ENCOUNTER — Inpatient Hospital Stay: Payer: Medicaid Other

## 2018-02-27 ENCOUNTER — Inpatient Hospital Stay: Payer: Medicaid Other | Admitting: Oncology

## 2018-02-27 NOTE — Telephone Encounter (Signed)
Called pt first and no answer but left message on cell phone.  Left message that we were cancelling today's appts and have his scan scheduled. It was suppose to be scheduled before he came today and it was not done.  I could not reach patient and tried his home phone and spoke to his wife and she will give him the message. I told her I will send him an email and gave her the scan appt and the f/u appt.. I did send email with all dates and address to Theodore Morton. Then Dr. Smith Robertao wanted him to have labs done prior to scan so I have added a lab on and sent it in email to pt.

## 2018-02-28 NOTE — Progress Notes (Signed)
Letter sent to My Chart and mailed to patient. Unable to reach by phone.

## 2018-02-28 NOTE — Progress Notes (Signed)
Letter mailed to pt and sent to My Chart.

## 2018-03-07 ENCOUNTER — Inpatient Hospital Stay: Payer: Medicaid Other | Attending: Oncology

## 2018-03-07 ENCOUNTER — Ambulatory Visit
Admission: RE | Admit: 2018-03-07 | Discharge: 2018-03-07 | Disposition: A | Discharge status: Home / Self Care | Payer: Medicaid Other | Source: Ambulatory Visit | Attending: Oncology | Admitting: Oncology

## 2018-03-07 DIAGNOSIS — K7689 Other specified diseases of liver: Secondary | ICD-10-CM | POA: Diagnosis not present

## 2018-03-07 DIAGNOSIS — R591 Generalized enlarged lymph nodes: Secondary | ICD-10-CM | POA: Insufficient documentation

## 2018-03-07 DIAGNOSIS — K760 Fatty (change of) liver, not elsewhere classified: Secondary | ICD-10-CM | POA: Diagnosis not present

## 2018-03-07 DIAGNOSIS — R599 Enlarged lymph nodes, unspecified: Secondary | ICD-10-CM

## 2018-03-07 MED ORDER — IOHEXOL 300 MG/ML  SOLN
100.0000 mL | Freq: Once | INTRAMUSCULAR | Status: AC | PRN
  Administered 2018-03-07: 100 mL via INTRAVENOUS

## 2018-03-28 ENCOUNTER — Telehealth: Payer: Self-pay

## 2018-03-28 MED ORDER — BISACODYL 5 MG PO TBEC: 10.0000 mg | DELAYED_RELEASE_TABLET | Freq: Once | ORAL | 0 refills | Status: AC

## 2018-03-28 MED ORDER — PEG 3350-KCL-NA BICARB-NACL 420 G PO SOLR: 4000.0000 mL | Freq: Once | ORAL | 0 refills | Status: AC

## 2018-03-28 NOTE — Telephone Encounter (Signed)
Pt here with wife and made aware of 04/09/18 ultrasound and will call with date for colonoscopy. Prep instructions given. Rx for [facility] L[facility] and dulcolax tabs sent.

## 2018-04-06 ENCOUNTER — Telehealth: Payer: Self-pay | Admitting: Gastroenterology

## 2018-04-06 NOTE — Telephone Encounter (Signed)
Theodore Morton from [facility] [place] left vm pt is having abdomin U/S and needs pre auth bc its exspired please call her at # [phone]

## 2018-04-06 NOTE — Telephone Encounter (Signed)
Updated prior authorization obtained for CPT code 40981.

## 2018-04-09 ENCOUNTER — Telehealth: Payer: Self-pay | Admitting: Gastroenterology

## 2018-04-09 ENCOUNTER — Ambulatory Visit: Payer: Medicaid Other

## 2018-04-09 NOTE — Telephone Encounter (Signed)
STEPHANIE FROM [facility] CALLED TO INFORM us PT DID NOT SHOW UP FOR U/S

## 2018-04-09 NOTE — Telephone Encounter (Signed)
Left message to contact office due to missing his ultrasound appt this morning and needs to contact office to reschedule.

## 2018-04-25 NOTE — Telephone Encounter (Signed)
Sent a message to patient to contact central scheduling.

## 2018-05-07 ENCOUNTER — Ambulatory Visit: Payer: Medicaid Other | Admitting: Gastroenterology

## 2018-05-07 ENCOUNTER — Encounter: Payer: Self-pay | Admitting: Gastroenterology

## 2018-12-01 IMAGING — DX DG SHOULDER 2+V*L*
3 series · 3 of 3 positions shown · non-contrast
Comparison: None.

CLINICAL DATA: Left shoulder pain omen no known injury, initial
encounter

EXAM:
LEFT SHOULDER - 2+ VIEW

[shoulder (grashey) ap]
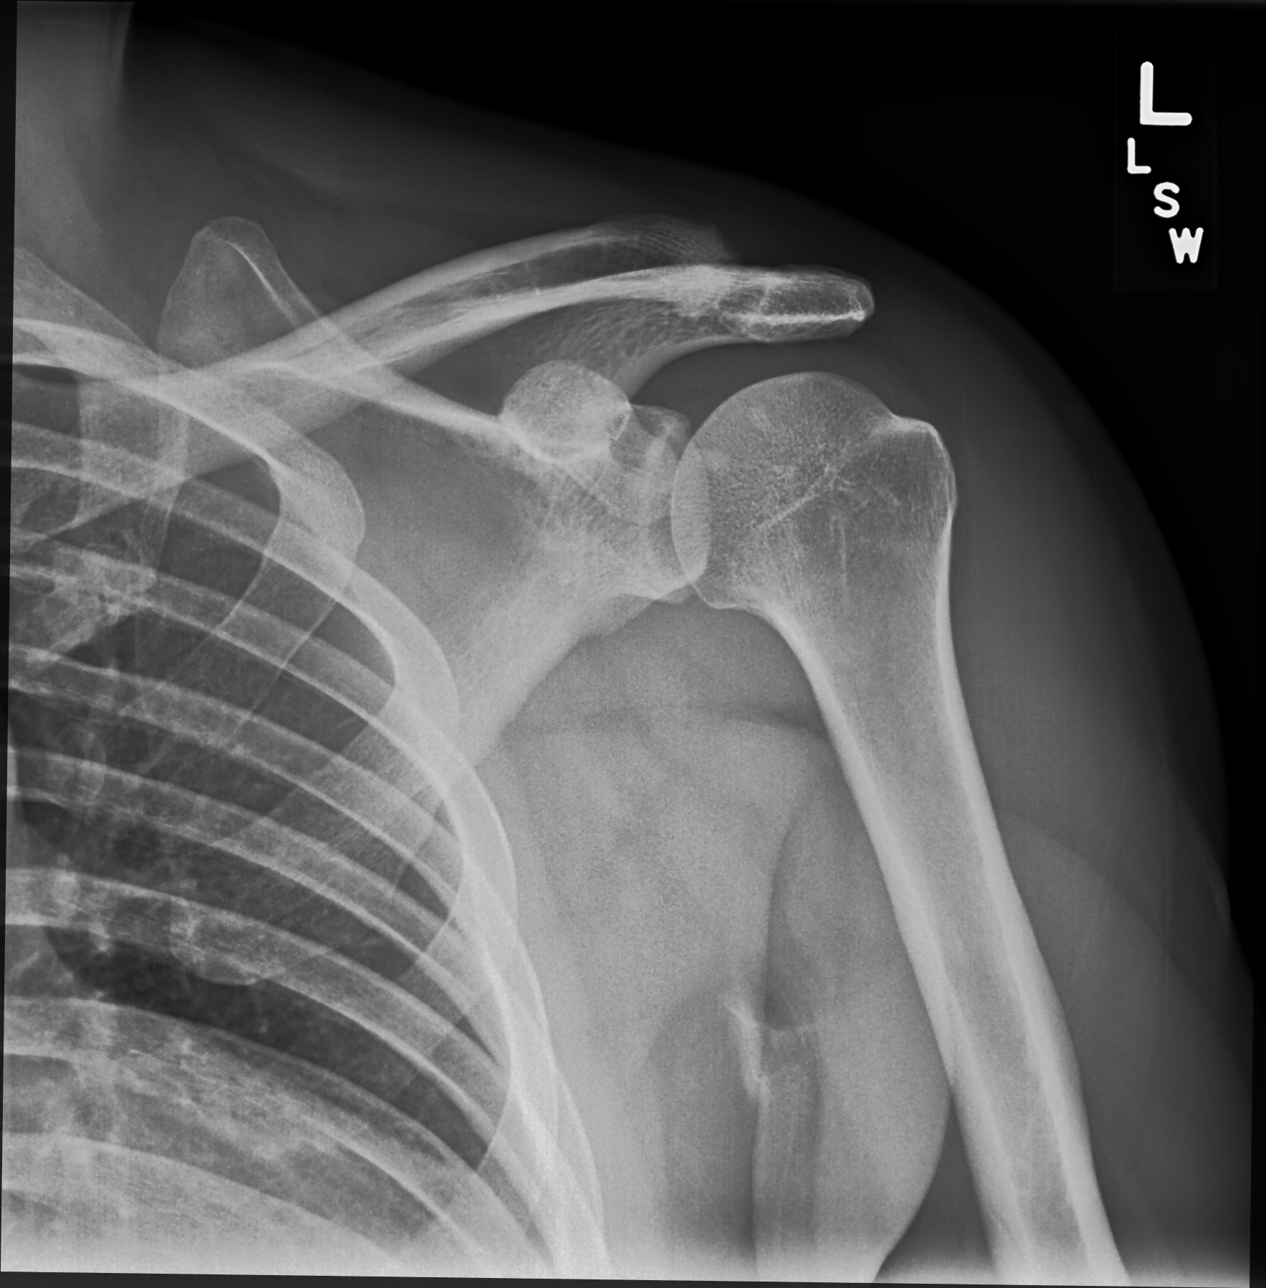

[shoulder y view]
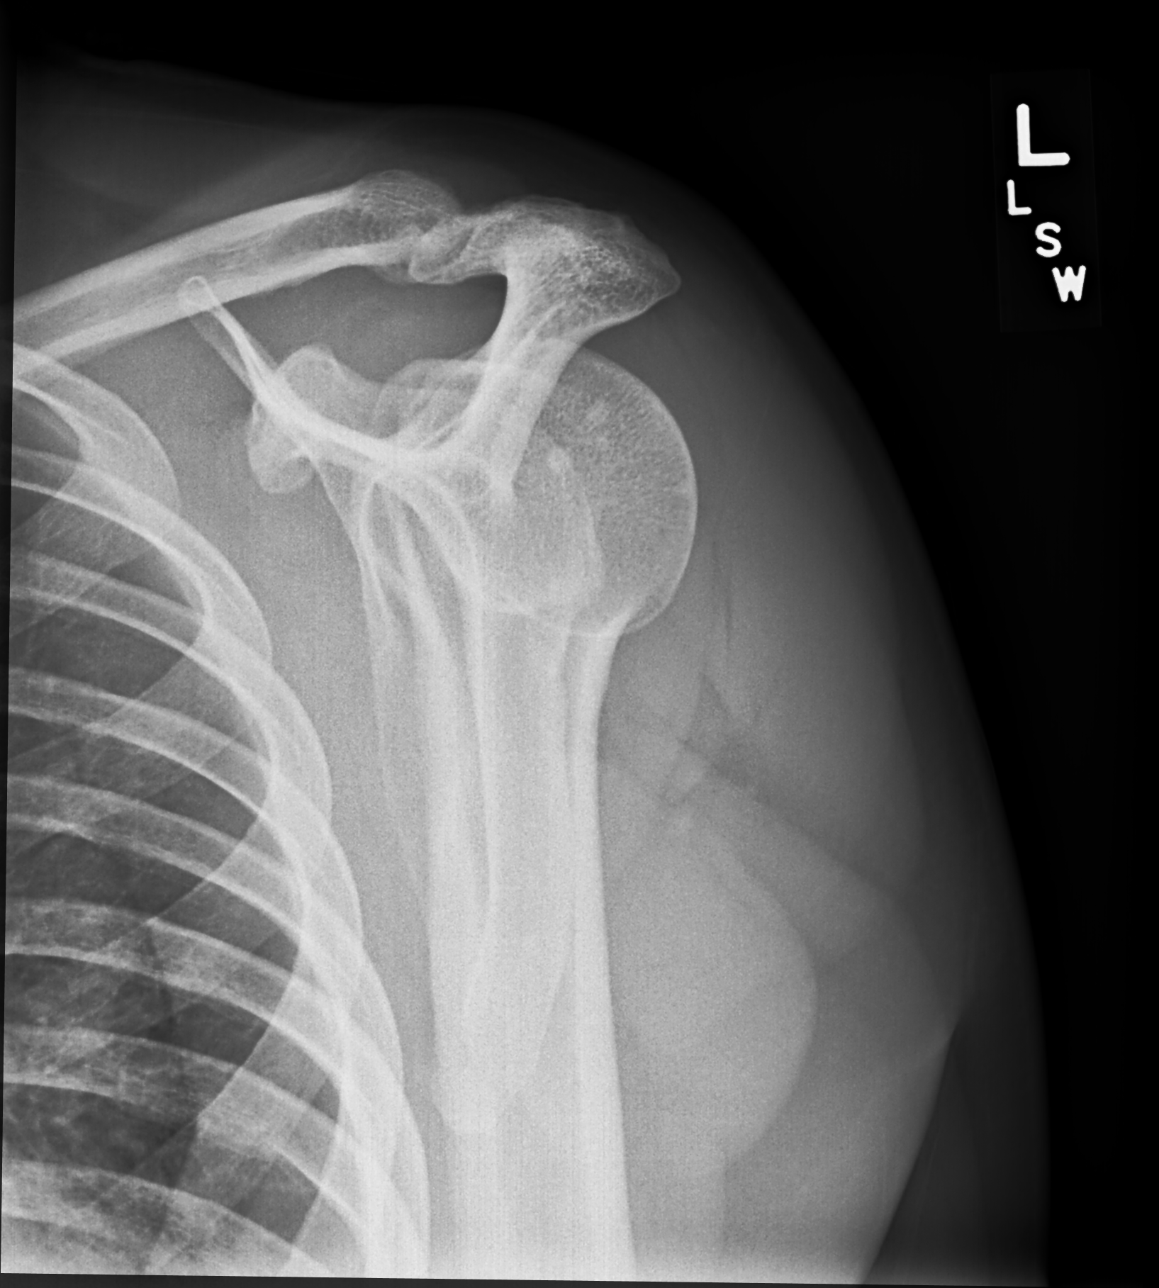

[shoulder axillary pa]
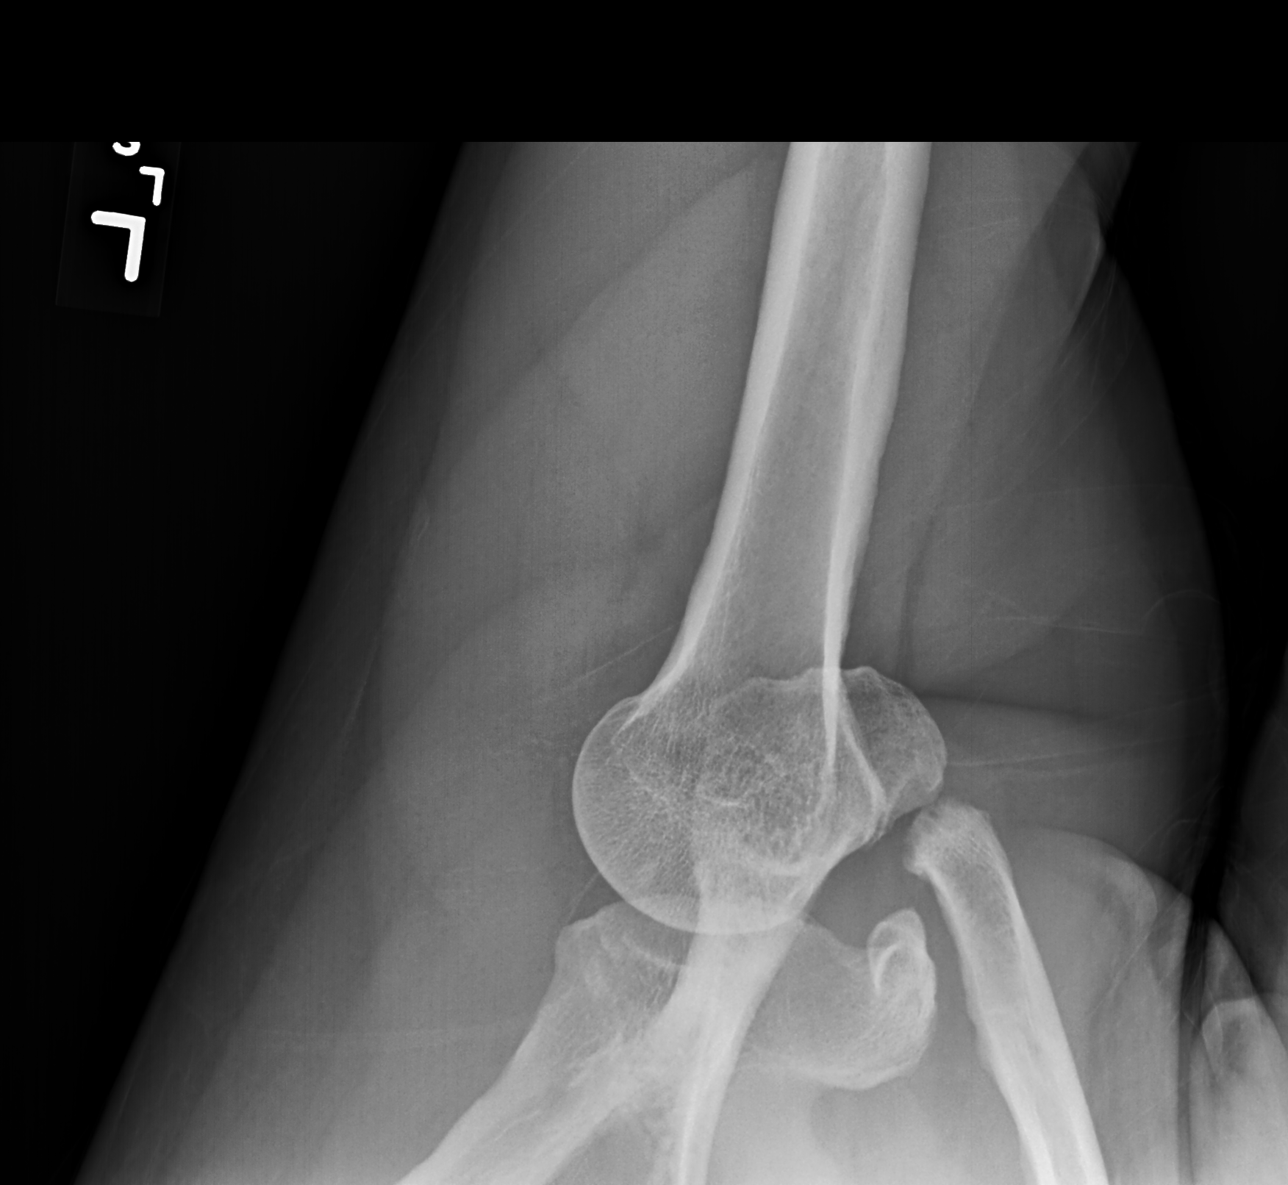

[3 of 3 positions shown; findings below may reference images not displayed]

FINDINGS: Mild degenerative change of the acromioclavicular joint is noted. No
acute fracture or dislocation is seen. No soft tissue abnormality is
noted.
IMPRESSION: Mild degenerative change without acute abnormality.
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
## 2019-09-09 ENCOUNTER — Other Ambulatory Visit: Payer: Self-pay

## 2019-09-09 ENCOUNTER — Emergency Department
Admission: EM | Admit: 2019-09-09 | Discharge: 2019-09-09 | Disposition: A | Discharge status: Home / Self Care | Payer: BLUE CROSS/BLUE SHIELD | Attending: Student | Admitting: Student

## 2019-09-09 ENCOUNTER — Encounter: Payer: Self-pay | Admitting: Emergency Medicine

## 2019-09-09 DIAGNOSIS — Y998 Other external cause status: Secondary | ICD-10-CM | POA: Diagnosis not present

## 2019-09-09 DIAGNOSIS — I10 Essential (primary) hypertension: Secondary | ICD-10-CM | POA: Insufficient documentation

## 2019-09-09 DIAGNOSIS — Y92018 Other place in single-family (private) house as the place of occurrence of the external cause: Secondary | ICD-10-CM | POA: Insufficient documentation

## 2019-09-09 DIAGNOSIS — S0502XA Injury of conjunctiva and corneal abrasion without foreign body, left eye, initial encounter: Secondary | ICD-10-CM | POA: Insufficient documentation

## 2019-09-09 DIAGNOSIS — Y9301 Activity, walking, marching and hiking: Secondary | ICD-10-CM | POA: Insufficient documentation

## 2019-09-09 DIAGNOSIS — Z79899 Other long term (current) drug therapy: Secondary | ICD-10-CM | POA: Insufficient documentation

## 2019-09-09 DIAGNOSIS — S0592XA Unspecified injury of left eye and orbit, initial encounter: Secondary | ICD-10-CM | POA: Diagnosis present

## 2019-09-09 DIAGNOSIS — W228XXA Striking against or struck by other objects, initial encounter: Secondary | ICD-10-CM | POA: Diagnosis not present

## 2019-09-09 DIAGNOSIS — H1132 Conjunctival hemorrhage, left eye: Secondary | ICD-10-CM | POA: Diagnosis not present

## 2019-09-09 MED ORDER — POLYMYXIN B-TRIMETHOPRIM 10000-0.1 UNIT/ML-% OP SOLN: 2.0000 [drp] | Freq: Four times a day (QID) | OPHTHALMIC | 0 refills | Status: DC

## 2019-09-09 MED ORDER — FLUORESCEIN SODIUM 1 MG OP STRP
1.0000 | ORAL_STRIP | Freq: Once | OPHTHALMIC | Status: DC
  Filled 2019-09-09: qty 1

## 2019-09-09 MED ORDER — TETRACAINE HCL 0.5 % OP SOLN
2.0000 [drp] | Freq: Once | OPHTHALMIC | Status: DC
  Filled 2019-09-09: qty 4

## 2019-09-09 MED ORDER — KETOROLAC TROMETHAMINE 0.5 % OP SOLN: 1.0000 [drp] | Freq: Four times a day (QID) | OPHTHALMIC | 0 refills | Status: DC
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
## 2019-09-09 NOTE — ED Provider Notes (Signed)
[facility] Emergency Department Provider Note  ____________________________________________  Time seen: Approximately 10:47 PM  I have reviewed the triage vital signs and the nursing notes.   HISTORY  Chief Complaint Eye Injury    HPI Theodore Morton is a 48 y.o. male who presents the emergency department complaining of left eye pain/injury.  Patient reports that he walked into a spiderweb on his porch, as he was frantically trying to brush the spider off, he accidentally hit his left eye with his left thumb.  This occurred yesterday but patient has had continued pain to the eye.  He reports that he has redness to the lateral aspect of the eye where he made contact with his fingernail.  No visual changes.  Patient does not wear glasses or contacts.  No medications prior to arrival.  No other complaints.         Past Medical History:  Diagnosis Date  . Anemia   . Anxiety   . Hypertension     Patient Active Problem List   Diagnosis Date Noted  . Back pain 01/06/2017  . Neck pain 12/25/2016  . BRBPR (bright red blood per rectum) 12/13/2016  . Anemia 12/08/2016  . Chronic left shoulder pain 11/04/2016  . Obesity (BMI 30-39.9) 11/04/2016  . Essential hypertension 11/04/2016    Past Surgical History:  Procedure Laterality Date  . NO PAST SURGERIES      Prior to Admission medications   Medication Sig Start Date End Date Taking? Authorizing Provider  amLODipine (NORVASC) 10 MG tablet Take 1 tablet (10 mg total) by mouth daily. 01/24/17   Tommie Sams, DO  cyclobenzaprine (FLEXERIL) 10 MG tablet Take by mouth. 12/23/16   [provider]  ferrous sulfate 325 (65 FE) MG tablet Take by mouth. 12/12/16   [provider]  folic acid (FOLVITE) 1 MG tablet Take by mouth. 12/12/16   [provider]  hydrochlorothiazide (MICROZIDE) 12.5 MG capsule Take 1 capsule (12.5 mg total) by mouth daily. 01/12/17   Tommie Sams, DO   HYDROcodone-acetaminophen (NORCO/VICODIN) 5-325 MG tablet Take by mouth. 12/19/16   [provider]  ketorolac (ACULAR) 0.5 % ophthalmic solution Place 1 drop into the left eye 4 (four) times daily. 09/09/19   Torry Istre, Delorise Royals, PA-C  meloxicam (MOBIC) 15 MG tablet Take by mouth. 12/23/16   [provider]  Phenyleph-Doxylamine-DM-APAP (TYLENOL COLD MULTI-SYMPTOM PO) Take by mouth as needed.    [provider]  trimethoprim-polymyxin b (POLYTRIM) ophthalmic solution Place 2 drops into the left eye every 6 (six) hours. 09/09/19   Barnet Benavides, Delorise Royals, PA-C  vitamin B-12 (CYANOCOBALAMIN) 1000 MCG tablet Take 1,000 mcg by mouth daily.    [provider]    Allergies Patient has no known allergies.  Family History  Problem Relation Age of Onset  . Breast cancer Mother   . Diabetes Father     Social History Social History   Tobacco Use  . Smoking status: Never Smoker  . Smokeless tobacco: Never Used  Substance Use Topics  . Alcohol use: Yes    Alcohol/week: 1.0 - 2.0 standard drinks    Types: 1 - 2 Standard drinks or equivalent per week  . Drug use: No     Review of Systems  Constitutional: No fever/chills Eyes: No visual changes. No discharge.  Injury and pain to the left eye ENT: No upper respiratory complaints. Cardiovascular: no chest pain. Respiratory: no cough. No SOB. Gastrointestinal: No abdominal pain.  No  nausea, no vomiting.   Musculoskeletal: Negative for musculoskeletal pain. Skin: Negative for rash, abrasions, lacerations, ecchymosis. Neurological: Negative for headaches, focal weakness or numbness. 10-point ROS otherwise negative.  ____________________________________________   PHYSICAL EXAM:  VITAL SIGNS: ED Triage Vitals  Enc Vitals Group     BP 09/09/19 2220 (!) 144/93     Pulse Rate 09/09/19 2220 85     Resp 09/09/19 2220 18     Temp 09/09/19 2220 98.3 F (36.8 C)     Temp Source 09/09/19 2220 Oral     SpO2  09/09/19 2220 99 %     Weight 09/09/19 2222 239 lb (108.4 kg)     Height 09/09/19 2222 5\' 7"  (1.702 m)     Head Circumference --      Peak Flow --      Pain Score 09/09/19 2227 8     Pain Loc --      Pain Edu? --      Excl. in [place]? --      Constitutional: Alert and oriented. Well appearing and in no acute distress. Eyes: Conjunctivae on left is erythematous consistent with conjunctival hemorrhage.  Funduscopic exam is reassuring bilaterally.  Red reflex bilaterally.  Vasculature and optic disc is unremarkable bilaterally.  No evidence of hyphema.  PERRL. EOMI. left eye is anesthetized using tetracaine drops.  Fluorescein staining applied with area of uptake in the 3 o'clock position.  This overlies the area of conjunctival hemorrhage.  No foreign body Head: Atraumatic. ENT:      Ears:       Nose: No congestion/rhinnorhea.      Mouth/Throat: Mucous membranes are moist.  Neck: No stridor.    Cardiovascular: Normal rate, regular rhythm. Normal S1 and S2.  Good peripheral circulation. Respiratory: Normal respiratory effort without tachypnea or retractions. Lungs CTAB. Good air entry to the bases with no decreased or absent breath sounds. Musculoskeletal: Full range of motion to all extremities. No gross deformities appreciated. Neurologic:  Normal speech and language. No gross focal neurologic deficits are appreciated.  Skin:  Skin is warm, dry and intact. No rash noted. Psychiatric: Mood and affect are normal. Speech and behavior are normal. Patient exhibits appropriate insight and judgement.   ____________________________________________   LABS (all labs ordered are listed, but only abnormal results are displayed)  Labs Reviewed - No data to display ____________________________________________  EKG   ____________________________________________  RADIOLOGY   No results found.  ____________________________________________    PROCEDURES  Procedure(s) performed:     Procedures    Medications  fluorescein ophthalmic strip 1 strip (has no administration in time range)  tetracaine (PONTOCAINE) 0.5 % ophthalmic solution 2 drop (has no administration in time range)     ____________________________________________   INITIAL IMPRESSION / ASSESSMENT AND PLAN / ED COURSE  Pertinent labs & imaging results that were available during my care of the patient were reviewed by me and considered in my medical decision making (see chart for details).  Review of the [place] CSRS was performed in accordance of the [place] prior to dispensing any controlled drugs.           Patient's diagnosis is consistent with corneal abrasion with subconjunctival hemorrhage.  Patient presented to the emergency department complaining of left eye injury/pain.  Patient accidentally struck his left eye with trying to brush a spiderweb off his face last night.  Patient had continued pain as well as erythema along the lateral conjunctivo-.  Findings are consistent with corneal abrasion with  conjunctival hemorrhage.  No evidence of hyphema or other intraocular injury.. Patient will be discharged home with prescriptions for Acular and Polytrim eyedrops. Patient is to follow up with ophthalmology as needed or otherwise directed. Patient is given ED precautions to return to the ED for any worsening or new symptoms.     ____________________________________________  FINAL CLINICAL IMPRESSION(S) / ED DIAGNOSES  Final diagnoses:  Abrasion of left cornea, initial encounter  Conjunctival hemorrhage of left eye      NEW MEDICATIONS STARTED DURING THIS VISIT:  ED Discharge Orders         Ordered    ketorolac (ACULAR) 0.5 % ophthalmic solution  4 times daily     09/09/19 2307    trimethoprim-polymyxin b (POLYTRIM) ophthalmic solution  Every 6 hours     09/09/19 2307              This chart was dictated using voice recognition software/Dragon. Despite best efforts to proofread,  errors can occur which can change the meaning. Any change was purely unintentional.    Racheal PatchesCuthriell, Kimeka Badour D, PA-C 09/09/19 2340    Miguel AschoffMonks, Sarah L., MD 09/10/19 1409

## 2019-09-11 ENCOUNTER — Encounter: Payer: Self-pay | Admitting: *Deleted

## 2019-10-01 ENCOUNTER — Telehealth: Payer: Self-pay | Admitting: Gastroenterology

## 2019-10-01 NOTE — Telephone Encounter (Signed)
Pt left vm to schedule colonscopy °

## 2019-10-01 NOTE — Telephone Encounter (Signed)
Patient is returning call to [facility] to schedule a colonoscopy in referral work-q.

## 2020-01-02 ENCOUNTER — Other Ambulatory Visit: Payer: Self-pay

## 2020-01-02 ENCOUNTER — Other Ambulatory Visit
Admission: RE | Admit: 2020-01-02 | Discharge: 2020-01-02 | Disposition: A | Discharge status: Home / Self Care | Payer: BLUE CROSS/BLUE SHIELD | Source: Ambulatory Visit | Attending: Family Medicine | Admitting: Family Medicine

## 2020-01-02 DIAGNOSIS — Z20822 Contact with and (suspected) exposure to covid-19: Secondary | ICD-10-CM | POA: Diagnosis not present

## 2020-01-02 DIAGNOSIS — Z01812 Encounter for preprocedural laboratory examination: Secondary | ICD-10-CM | POA: Diagnosis not present

## 2020-01-02 LAB — SARS CORONAVIRUS 2 (TAT 6-24 HRS): SARS Coronavirus 2: NEGATIVE

## 2020-07-23 ENCOUNTER — Encounter: Payer: Self-pay | Admitting: Emergency Medicine

## 2020-07-23 ENCOUNTER — Emergency Department
Admission: EM | Admit: 2020-07-23 | Discharge: 2020-07-23 | Disposition: A | Discharge status: Home / Self Care | Payer: BLUE CROSS/BLUE SHIELD | Attending: Emergency Medicine | Admitting: Emergency Medicine

## 2020-07-23 DIAGNOSIS — I10 Essential (primary) hypertension: Secondary | ICD-10-CM | POA: Insufficient documentation

## 2020-07-23 DIAGNOSIS — M10071 Idiopathic gout, right ankle and foot: Secondary | ICD-10-CM | POA: Insufficient documentation

## 2020-07-23 DIAGNOSIS — Z79899 Other long term (current) drug therapy: Secondary | ICD-10-CM | POA: Insufficient documentation

## 2020-07-23 LAB — CBC WITH DIFFERENTIAL/PLATELET
Abs Immature Granulocytes: 0.01 10*3/uL (ref 0.00–0.07)
Basophils Absolute: 0 10*3/uL (ref 0.0–0.1)
Basophils Relative: 1 %
Eosinophils Absolute: 0.2 10*3/uL (ref 0.0–0.5)
Eosinophils Relative: 4 %
HCT: 37.3 % — ABNORMAL LOW (ref 39.0–52.0)
Hemoglobin: 11.7 g/dL — ABNORMAL LOW (ref 13.0–17.0)
Immature Granulocytes: 0 %
Lymphocytes Relative: 39 %
Lymphs Abs: 2.7 10*3/uL (ref 0.7–4.0)
MCH: 28.1 pg (ref 26.0–34.0)
MCHC: 31.4 g/dL (ref 30.0–36.0)
MCV: 89.7 fL (ref 80.0–100.0)
Monocytes Absolute: 0.6 10*3/uL (ref 0.1–1.0)
Monocytes Relative: 9 %
Neutro Abs: 3.3 10*3/uL (ref 1.7–7.7)
Neutrophils Relative %: 47 %
Platelets: 335 10*3/uL (ref 150–400)
RBC: 4.16 MIL/uL — ABNORMAL LOW (ref 4.22–5.81)
RDW: 11.9 % (ref 11.5–15.5)
WBC: 6.9 10*3/uL (ref 4.0–10.5)
nRBC: 0 % (ref 0.0–0.2)

## 2020-07-23 LAB — URIC ACID: Uric Acid, Serum: 10.3 mg/dL — ABNORMAL HIGH (ref 3.7–8.6)

## 2020-07-23 MED ORDER — DICLOFENAC SODIUM 75 MG PO TBEC: 75.0000 mg | DELAYED_RELEASE_TABLET | Freq: Two times a day (BID) | ORAL | 0 refills | Status: AC

## 2020-07-23 MED ORDER — HYDROCODONE-ACETAMINOPHEN 5-325 MG PO TABS: 1.0000 | ORAL_TABLET | Freq: Three times a day (TID) | ORAL | 0 refills | Status: AC

## 2020-07-23 MED ORDER — COLCHICINE 0.6 MG PO TABS
1.2000 mg | ORAL_TABLET | Freq: Once | ORAL | Status: AC
  Administered 2020-07-23: 1.2 mg via ORAL
  Filled 2020-07-23: qty 2

## 2020-07-23 NOTE — ED Triage Notes (Signed)
Pt c/p right foot pain at the base of great toe. Area is red and swollen. Pt denies injury.

## 2020-07-23 NOTE — ED Notes (Signed)
See triage note, pt to ED for right foot pain that started yesterday. Lateral side of foot red and swollen upon assessment. Denies diabetic hx, fevers.  Reports increased pain with ambulation

## 2020-07-23 NOTE — ED Provider Notes (Addendum)
[facility] Emergency Department Provider Note ____________________________________________  Time seen: 2150  I have reviewed the triage vital signs and the nursing notes.  HISTORY  Chief Complaint  Foot Pain  HPI Theodore Morton is a 49 y.o. male with the below medical history, presents himself to the ED from work, for acute pain to the right foot.  Patient describes a tender, red, great toe joint, without any known trauma.  He describes onset yesterday, and has had increasing pain since that time.  Patient denies any trauma to the foot, he denies any history of chronic or ongoing joint pain.  He also denies any fever, chills, or sweats.   Past Medical History:  Diagnosis Date  . Anemia   . Anxiety   . Hypertension     Patient Active Problem List   Diagnosis Date Noted  . Back pain 01/06/2017  . Neck pain 12/25/2016  . BRBPR (bright red blood per rectum) 12/13/2016  . Anemia 12/08/2016  . Chronic left shoulder pain 11/04/2016  . Obesity (BMI 30-39.9) 11/04/2016  . Essential hypertension 11/04/2016    Past Surgical History:  Procedure Laterality Date  . NO PAST SURGERIES      Prior to Admission medications   Medication Sig Start Date End Date Taking? Authorizing Provider  atorvastatin (LIPITOR) 20 MG tablet Take 20 mg by mouth daily.   Yes [provider]  amLODipine (NORVASC) 10 MG tablet Take 1 tablet (10 mg total) by mouth daily. 01/24/17   Tommie Sams, DO  diclofenac (VOLTAREN) 75 MG EC tablet Take 1 tablet (75 mg total) by mouth 2 (two) times daily. 07/23/20   Treyveon Mochizuki, Charlesetta Ivory, PA-C  hydrochlorothiazide (MICROZIDE) 12.5 MG capsule Take 1 capsule (12.5 mg total) by mouth daily. 01/12/17   Tommie Sams, DO  HYDROcodone-acetaminophen (NORCO) 5-325 MG tablet Take 1 tablet by mouth 3 (three) times daily. 07/23/20   Lorean Ekstrand, Charlesetta Ivory, PA-C    Allergies Patient has no known allergies.  Family History  Problem Relation Age of Onset   . Breast cancer Mother   . Diabetes Father     Social History Social History   Tobacco Use  . Smoking status: Never Smoker  . Smokeless tobacco: Never Used  Vaping Use  . Vaping Use: Never used  Substance Use Topics  . Alcohol use: Yes    Alcohol/week: 1.0 - 2.0 standard drink    Types: 1 - 2 Standard drinks or equivalent per week  . Drug use: No    Review of Systems  Constitutional: Negative for fever. Cardiovascular: Negative for chest pain. Respiratory: Negative for shortness of breath. Musculoskeletal: Negative for back pain.  Right great toe joint pain as above. Skin: Negative for rash. Neurological: Negative for headaches, focal weakness or numbness. ____________________________________________  PHYSICAL EXAM:  VITAL SIGNS: ED Triage Vitals [07/23/20 2049]  Enc Vitals Group     BP (!) 155/106     Pulse Rate 92     Resp 18     Temp 98.2 F (36.8 C)     Temp Source Oral     SpO2 98 %     Weight      Height      Head Circumference      Peak Flow      Pain Score      Pain Loc      Pain Edu?      Excl. in [place]?     Constitutional: Alert and oriented.  Well appearing and in no distress. Head: Normocephalic and atraumatic. Eyes: Conjunctivae are normal. Normal extraocular movements Cardiovascular: Normal rate, regular rhythm. Normal distal pulses. Respiratory: Normal respiratory effort. Musculoskeletal: Right great toe with erythema noted at the MCP.  Patient is exquisitely tender to light touch over the same region.  No joint deformity or effusion is noted.  Nontender with normal range of motion in all extremities.  Neurologic:  Normal gross sensation.  Normal speech and language. No gross focal neurologic deficits are appreciated. Skin:  Skin is warm, dry and intact. No rash noted. ____________________________________________   LABS (pertinent positives/negatives)*   Labs Reviewed  CBC WITH DIFFERENTIAL/PLATELET - Abnormal; Notable for the following  components:      Result Value   RBC 4.16 (*)    Hemoglobin 11.7 (*)    HCT 37.3 (*)    All other components within normal limits  URIC ACID - Abnormal; Notable for the following components:   Uric Acid, Serum 10.3 (*)    All other components within normal limits  ____________________________________________  PROCEDURES  Colchicine 1.2 mg PO  Procedures ____________________________________________  INITIAL IMPRESSION / ASSESSMENT AND PLAN / ED COURSE  DDX: septic arthritis, trauma, OA, tendinitis, cellulitis, gout  Patient with ED evaluation of acute onset of exquisite tenderness to the right great toe at the MCP.  Clinically the patient presents with a erythematous tender MCP consistent with likely gout flare.  This would represent his first gout flare.  Patient with labs are overall reassuring at this time and a uric acid that is elevated at 10.3.  Patient will be discharged with a prescription for diclifenac as well as hydrocodone.  He will follow-up with his primary provider for ongoing symptoms.  He is also advised to schedule an appointment with a dermatologist for ongoing evaluation of his chronic foot dermatitis and pruritus.  Theodore Morton was evaluated in Emergency Department on 07/23/2020 for the symptoms described in the history of present illness. He was evaluated in the context of the global COVID-19 pandemic, which necessitated consideration that the patient might be at risk for infection with the SARS-CoV-2 virus that causes COVID-19. Institutional protocols and algorithms that pertain to the evaluation of patients at risk for COVID-19 are in a state of rapid change based on information released by regulatory bodies including the CDC and federal and state organizations. These policies and algorithms were followed during the patient's care in the ED.  I reviewed the patient's prescription history over the last 12 months in the multi-state controlled substances database(s) that  includes [place], [place], [place], [place], [place], [place], [place], [place], [place], [place], [place], [place], [place], and [place].  Results were notable for no RX history.  ____________________________________________  FINAL CLINICAL IMPRESSION(S) / ED DIAGNOSES  Final diagnoses:  Acute idiopathic gout involving toe of right foot      Karmen Stabs, Charlesetta Ivory, PA-C 07/23/20 2311    Nyima Vanacker, Charlesetta Ivory, PA-C 07/23/20 2312    Shaune Pollack, MD 07/24/20 [phone]

## 2020-07-23 NOTE — ED Notes (Signed)
Pt discharged home after verbalizing understanding of discharge instructions; nad noted. 

## 2020-07-23 NOTE — Discharge Instructions (Addendum)
You are being treated for a presumed acute gout flare.  Take the colchicine as directed and the pain medicine as needed.  You may follow-up with your primary provider for ongoing symptoms.  Please review the information on low purine eating to help prevent future flares.  Should also establish care with a local dermatologist for further evaluation of your foot dermatitis.  Return to the ED as needed.

## 2020-08-04 ENCOUNTER — Encounter: Payer: Self-pay | Admitting: Emergency Medicine

## 2020-08-04 ENCOUNTER — Other Ambulatory Visit: Payer: Self-pay

## 2020-08-04 ENCOUNTER — Emergency Department
Admission: EM | Admit: 2020-08-04 | Discharge: 2020-08-04 | Disposition: A | Discharge status: Home / Self Care | Payer: Self-pay | Attending: Emergency Medicine | Admitting: Emergency Medicine

## 2020-08-04 DIAGNOSIS — M10071 Idiopathic gout, right ankle and foot: Secondary | ICD-10-CM | POA: Insufficient documentation

## 2020-08-04 DIAGNOSIS — Z79899 Other long term (current) drug therapy: Secondary | ICD-10-CM | POA: Insufficient documentation

## 2020-08-04 DIAGNOSIS — M109 Gout, unspecified: Secondary | ICD-10-CM

## 2020-08-04 DIAGNOSIS — I10 Essential (primary) hypertension: Secondary | ICD-10-CM | POA: Insufficient documentation

## 2020-08-04 HISTORY — DX: Gout, unspecified: M10.9

## 2020-08-04 MED ORDER — DEXAMETHASONE SODIUM PHOSPHATE 10 MG/ML IJ SOLN
10.0000 mg | Freq: Once | INTRAMUSCULAR | Status: AC
  Administered 2020-08-04: 10 mg via INTRAMUSCULAR
  Filled 2020-08-04: qty 1

## 2020-08-04 MED ORDER — COLCHICINE 0.6 MG PO TABS: 0.6000 mg | ORAL_TABLET | Freq: Every day | ORAL | 0 refills | Status: DC

## 2020-08-04 MED ORDER — KETOROLAC TROMETHAMINE 30 MG/ML IJ SOLN
30.0000 mg | Freq: Once | INTRAMUSCULAR | Status: AC
  Administered 2020-08-04: 30 mg via INTRAMUSCULAR
  Filled 2020-08-04: qty 1

## 2020-08-04 MED ORDER — COLCHICINE 0.6 MG PO TABS
1.8000 mg | ORAL_TABLET | Freq: Once | ORAL | Status: AC
  Administered 2020-08-04: 1.8 mg via ORAL
  Filled 2020-08-04: qty 3

## 2020-08-04 NOTE — ED Triage Notes (Signed)
Pt reports a gout flare up in his right great toe

## 2020-08-04 NOTE — ED Provider Notes (Signed)
[facility] Emergency Department Provider Note  ____________________________________________  Time seen: Approximately 3:09 PM  I have reviewed the triage vital signs and the nursing notes.   HISTORY  Chief Complaint Gout    HPI Theodore Morton is a 49 y.o. male who presents the emergency department complaining of pain, erythema, edema to the great toe of the right foot.  Patient was seen 2 weeks ago, diagnosed with gout and placed on diclofenac.  Patient states that he took the medicine, did not seem to improve his symptoms much and he returns for further evaluation.  This is patient's first episode of gout.  No history of kidney dysfunction.  No other complaints.  This is localized to the MTP joint of the great toe         Past Medical History:  Diagnosis Date  . Anemia   . Anxiety   . Gout   . Hypertension     Patient Active Problem List   Diagnosis Date Noted  . Back pain 01/06/2017  . Neck pain 12/25/2016  . BRBPR (bright red blood per rectum) 12/13/2016  . Anemia 12/08/2016  . Chronic left shoulder pain 11/04/2016  . Obesity (BMI 30-39.9) 11/04/2016  . Essential hypertension 11/04/2016    Past Surgical History:  Procedure Laterality Date  . NO PAST SURGERIES      Prior to Admission medications   Medication Sig Start Date End Date Taking? Authorizing Provider  amLODipine (NORVASC) 10 MG tablet Take 1 tablet (10 mg total) by mouth daily. 01/24/17   Tommie Sams, DO  atorvastatin (LIPITOR) 20 MG tablet Take 20 mg by mouth daily.    [provider]  colchicine 0.6 MG tablet Take 1 tablet (0.6 mg total) by mouth daily. Take 1 tab daily for at least 6 more days. If  Symptoms persist past 6 days continue to use until prescription is finished 08/04/20   Brookelynne Dimperio, Delorise Royals, PA-C  diclofenac (VOLTAREN) 75 MG EC tablet Take 1 tablet (75 mg total) by mouth 2 (two) times daily. 07/23/20   Menshew, Charlesetta Ivory, PA-C  hydrochlorothiazide  (MICROZIDE) 12.5 MG capsule Take 1 capsule (12.5 mg total) by mouth daily. 01/12/17   Tommie Sams, DO  HYDROcodone-acetaminophen (NORCO) 5-325 MG tablet Take 1 tablet by mouth 3 (three) times daily. 07/23/20   Menshew, Charlesetta Ivory, PA-C    Allergies Patient has no known allergies.  Family History  Problem Relation Age of Onset  . Breast cancer Mother   . Diabetes Father     Social History Social History   Tobacco Use  . Smoking status: Never Smoker  . Smokeless tobacco: Never Used  Vaping Use  . Vaping Use: Never used  Substance Use Topics  . Alcohol use: Yes    Alcohol/week: 1.0 - 2.0 standard drink    Types: 1 - 2 Standard drinks or equivalent per week  . Drug use: No     Review of Systems  Constitutional: No fever/chills Eyes: No visual changes. No discharge ENT: No upper respiratory complaints. Cardiovascular: no chest pain. Respiratory: no cough. No SOB. Gastrointestinal: No abdominal pain.  No nausea, no vomiting.  No diarrhea.  No constipation. Musculoskeletal: Gouty arthritis to the MTP joint of the great toe Skin: Negative for rash, abrasions, lacerations, ecchymosis. Neurological: Negative for headaches, focal weakness or numbness. 10-point ROS otherwise negative.  ____________________________________________   PHYSICAL EXAM:  VITAL SIGNS: ED Triage Vitals  Enc Vitals Group     BP  08/04/20 1341 (!) 152/100     Pulse Rate 08/04/20 1341 (!) 101     Resp 08/04/20 1341 19     Temp 08/04/20 1341 98.1 F (36.7 C)     Temp Source 08/04/20 1341 Oral     SpO2 08/04/20 1341 98 %     Weight 08/04/20 1254 239 lb (108.4 kg)     Height 08/04/20 1254 5\' 7"  (1.702 m)     Head Circumference --      Peak Flow --      Pain Score 08/04/20 1254 10     Pain Loc --      Pain Edu? --      Excl. in [place]? --      Constitutional: Alert and oriented. Well appearing and in no acute distress. Eyes: Conjunctivae are normal. PERRL. EOMI. Head: Atraumatic. ENT:       Ears:       Nose: No congestion/rhinnorhea.      Mouth/Throat: Mucous membranes are moist.  Neck: No stridor.    Cardiovascular: Normal rate, regular rhythm. Normal S1 and S2.  Good peripheral circulation. Respiratory: Normal respiratory effort without tachypnea or retractions. Lungs CTAB. Good air entry to the bases with no decreased or absent breath sounds. Musculoskeletal: Full range of motion to all extremities. No gross deformities appreciated.  Visualization of the right foot reveals findings consistent with gout to the MTP joint of the great toe.  No overlying skin changes no deformity.  No other areas of tenderness other than the MTP joint.  Sensation and capillary refill intact. Neurologic:  Normal speech and language. No gross focal neurologic deficits are appreciated.  Skin:  Skin is warm, dry and intact. No rash noted. Psychiatric: Mood and affect are normal. Speech and behavior are normal. Patient exhibits appropriate insight and judgement.   ____________________________________________   LABS (all labs ordered are listed, but only abnormal results are displayed)  Labs Reviewed - No data to display ____________________________________________  EKG   ____________________________________________  RADIOLOGY   No results found.  ____________________________________________    PROCEDURES  Procedure(s) performed:    Procedures    Medications  ketorolac (TORADOL) 30 MG/ML injection 30 mg (has no administration in time range)  dexamethasone (DECADRON) injection 10 mg (has no administration in time range)  colchicine tablet 1.8 mg (has no administration in time range)     ____________________________________________   INITIAL IMPRESSION / ASSESSMENT AND PLAN / ED COURSE  Pertinent labs & imaging results that were available during my care of the patient were reviewed by me and considered in my medical decision making (see chart for details).  Review of the  [place] CSRS was performed in accordance of the [facility] prior to dispensing any controlled drugs.           Patient's diagnosis is consistent with gout of the great toe right foot.  Patient presented to emergency department with complaint of ongoing gout symptoms.  Patient been placed on diclofenac for his gout.  At this time I will give patient Toradol, Decadron injections and start colchicine.  Patient will have a prescription for same.  No indication for further work-up at this time.  Follow-up with primary care as needed..  Patient is given ED precautions to return to the ED for any worsening or new symptoms.     ____________________________________________  FINAL CLINICAL IMPRESSION(S) / ED DIAGNOSES  Final diagnoses:  Acute gout involving toe of right foot, unspecified cause      NEW  MEDICATIONS STARTED DURING THIS VISIT:  ED Discharge Orders         Ordered    colchicine 0.6 MG tablet  Daily        08/04/20 1527              This chart was dictated using voice recognition software/Dragon. Despite best efforts to proofread, errors can occur which can change the meaning. Any change was purely unintentional.    Racheal Patches, PA-C 08/04/20 1529    Dionne Bucy, MD 08/04/20 [phone]

## 2020-08-04 NOTE — ED Notes (Signed)
See triage note  Was recently dx'd with gout  States he has had cont'd pain to great toe since

## 2023-02-24 ENCOUNTER — Emergency Department: Payer: Medicaid Other

## 2023-02-24 ENCOUNTER — Emergency Department
Admission: EM | Admit: 2023-02-24 | Discharge: 2023-02-24 | Disposition: A | Discharge status: Home / Self Care | Payer: Medicaid Other | Attending: Emergency Medicine | Admitting: Emergency Medicine

## 2023-02-24 ENCOUNTER — Other Ambulatory Visit: Payer: Self-pay

## 2023-02-24 DIAGNOSIS — I10 Essential (primary) hypertension: Secondary | ICD-10-CM | POA: Insufficient documentation

## 2023-02-24 DIAGNOSIS — J069 Acute upper respiratory infection, unspecified: Secondary | ICD-10-CM

## 2023-02-24 DIAGNOSIS — Z1152 Encounter for screening for COVID-19: Secondary | ICD-10-CM | POA: Insufficient documentation

## 2023-02-24 LAB — RESP PANEL BY RT-PCR (RSV, FLU A&B, COVID)  RVPGX2
Influenza A by PCR: NEGATIVE
Influenza B by PCR: NEGATIVE
Resp Syncytial Virus by PCR: NEGATIVE
SARS Coronavirus 2 by RT PCR: NEGATIVE

## 2023-02-24 LAB — GROUP A STREP BY PCR: Group A Strep by PCR: NOT DETECTED

## 2023-02-24 MED ORDER — SULFACETAMIDE SODIUM 10 % OP SOLN: 2.0000 [drp] | Freq: Four times a day (QID) | OPHTHALMIC | 0 refills | Status: AC

## 2023-02-24 MED ORDER — AZITHROMYCIN 250 MG PO TABS: ORAL_TABLET | ORAL | 0 refills | Status: DC

## 2023-02-24 MED ORDER — FEXOFENADINE HCL 180 MG PO TABS: 180.0000 mg | ORAL_TABLET | Freq: Every day | ORAL | 3 refills | Status: AC

## 2023-02-24 MED ORDER — BENZONATATE 100 MG PO CAPS: 100.0000 mg | ORAL_CAPSULE | Freq: Three times a day (TID) | ORAL | 0 refills | Status: DC | PRN

## 2023-02-24 NOTE — ED Triage Notes (Signed)
Pt here with a sore throat and congestion. Pt states he has been sneezing and coughing a lot. Pt denies fever.

## 2023-02-24 NOTE — ED Provider Notes (Signed)
[facility] Provider Note    Event Date/Time   First MD Initiated Contact with Patient 02/24/23 1124     (approximate)   History   Shortness of Breath and Sore Throat   HPI  Theodore Morton is a 52 y.o. male with history of hypertension, anemia and gout presents emergency department complaining of cough and congestion, runny eyes that are matted in the mornings, no fever or chills.  No chest pain or shortness of breath      Physical Exam   Triage Vital Signs: ED Triage Vitals  Enc Vitals Group     BP 02/24/23 1111 (!) 155/105     Pulse Rate 02/24/23 1111 (!) 105     Resp 02/24/23 1111 18     Temp 02/24/23 1111 98.4 F (36.9 C)     Temp Source 02/24/23 1111 Oral     SpO2 02/24/23 1111 95 %     Weight 02/24/23 1111 238 lb 15.7 oz (108.4 kg)     Height 02/24/23 1111 5\' 7"  (1.702 m)     Head Circumference --      Peak Flow --      Pain Score 02/24/23 1110 5     Pain Loc --      Pain Edu? --      Excl. in [place]? --     Most recent vital signs: Vitals:   02/24/23 1111  BP: (!) 155/105  Pulse: (!) 105  Resp: 18  Temp: 98.4 F (36.9 C)  SpO2: 95%     General: Awake, no distress.   CV:  Good peripheral perfusion. regular rate and  rhythm Resp:  Normal effort. Lungs cta Abd:  No distention.   Other:  PERRL, EOMI, conjunctiva is injected with matting noted on the lashes, throat is normal, neck is supple, no lymphadenopathy   ED Results / Procedures / Treatments   Labs (all labs ordered are listed, but only abnormal results are displayed) Labs Reviewed  RESP PANEL BY RT-PCR (RSV, FLU A&B, COVID)  RVPGX2  GROUP A STREP BY PCR     EKG     RADIOLOGY Chest x-ray    PROCEDURES:   Procedures   MEDICATIONS ORDERED IN ED: Medications - No data to display   IMPRESSION / MDM / [place] / ED COURSE  I reviewed the triage vital signs and the nursing notes.                              Differential diagnosis  includes, but is not limited to, COVID, influenza, RSV, strep throat, conjunctivitis, URI  Patient's presentation is most consistent with acute complicated illness / injury requiring diagnostic workup.   Patient's labs are reassuring  Chest x-ray independently reviewed and interpreted by me as being negative for any acute abnormality  I did explain findings to the patient.  He was given a prescription for Z-Pak, Bleph-10 ophthalmic drops, Allegra 180 mg daily, and Tessalon Perles.  He is to follow-up with his regular doctor if not improving to 3 days.  Return if worsening.  Patient was discharged stable condition.      FINAL CLINICAL IMPRESSION(S) / ED DIAGNOSES   Final diagnoses:  Acute URI     Rx / DC Orders   ED Discharge Orders          Ordered    azithromycin (ZITHROMAX Z-PAK) 250 MG tablet  02/24/23 1330    sulfacetamide (BLEPH-10) 10 % ophthalmic solution  4 times daily        02/24/23 1330    fexofenadine (ALLEGRA) 180 MG tablet  Daily        02/24/23 1330    benzonatate (TESSALON PERLES) 100 MG capsule  3 times daily PRN        02/24/23 1330             Note:  This document was prepared using Dragon voice recognition software and may include unintentional dictation errors.    Versie Starks, PA-C 02/24/23 1334    Blake Divine, MD 02/26/23 [phone]

## 2023-02-28 ENCOUNTER — Emergency Department
Admission: EM | Admit: 2023-02-28 | Discharge: 2023-02-28 | Disposition: A | Discharge status: Home / Self Care | Payer: Medicaid Other | Attending: Emergency Medicine | Admitting: Emergency Medicine

## 2023-02-28 ENCOUNTER — Encounter: Payer: Self-pay | Admitting: Emergency Medicine

## 2023-02-28 DIAGNOSIS — Z79899 Other long term (current) drug therapy: Secondary | ICD-10-CM | POA: Insufficient documentation

## 2023-02-28 DIAGNOSIS — G4733 Obstructive sleep apnea (adult) (pediatric): Secondary | ICD-10-CM | POA: Insufficient documentation

## 2023-02-28 DIAGNOSIS — I10 Essential (primary) hypertension: Secondary | ICD-10-CM | POA: Insufficient documentation

## 2023-02-28 MED ORDER — AMLODIPINE BESYLATE 5 MG PO TABS
10.0000 mg | ORAL_TABLET | Freq: Once | ORAL | Status: AC
  Administered 2023-02-28: 10 mg via ORAL
  Filled 2023-02-28: qty 2

## 2023-02-28 MED ORDER — AMLODIPINE BESYLATE 10 MG PO TABS: 10.0000 mg | ORAL_TABLET | Freq: Every day | ORAL | 0 refills | Status: DC

## 2023-02-28 NOTE — Discharge Instructions (Signed)
Restart Amlodipine 10mg  daily for your blood pressure.  Please call the number above to schedule appointment with the lung doctor to talk about doing a sleep study.  Return to the ER for worsening symptoms, persistent vomiting, difficulty breathing or other concerns.

## 2023-02-28 NOTE — ED Triage Notes (Signed)
Pt presents ambulatory to triage via POV for screening for sleep apnea. Pt endorses sx including "gasping" to wake himself up, excessive snoring, and poor sleep for several months. He notes things increased after a URI last week. A&Ox4 at this time. Denies CP or SOB.

## 2023-02-28 NOTE — ED Provider Notes (Signed)
[facility] Provider Note    Event Date/Time   First MD Initiated Contact with Patient 02/28/23 [phone]     (approximate)   History   Sleeping Concern   HPI  Theodore Morton is a 52 y.o. male who presents to the ED from home with a chief complaint of concerns for obstructive sleep apnea.  Patient endorses symptoms for "a while", worse due to recent URI.  Symptoms include gasping for air, forcing himself to wake up, excessive snoring, fatigue during the day.  Also states he ran out of his blood pressure medicines a while ago.  Currently voices no complaints.  Denies chest pain or shortness of breath at present.     Past Medical History   Past Medical History:  Diagnosis Date   Anemia    Anxiety    Gout    Hypertension      Active Problem List   Patient Active Problem List   Diagnosis Date Noted   Back pain 01/06/2017   Neck pain 12/25/2016   BRBPR (bright red blood per rectum) 12/13/2016   Anemia 12/08/2016   Chronic left shoulder pain 11/04/2016   Obesity (BMI 30-39.9) 11/04/2016   Essential hypertension 11/04/2016     Past Surgical History   Past Surgical History:  Procedure Laterality Date   NO PAST SURGERIES       Home Medications   Prior to Admission medications   Medication Sig Start Date End Date Taking? Authorizing Provider  amLODipine (NORVASC) 10 MG tablet Take 1 tablet (10 mg total) by mouth daily. 01/24/17   Coral Spikes, DO  atorvastatin (LIPITOR) 20 MG tablet Take 20 mg by mouth daily.    [provider]  azithromycin (ZITHROMAX Z-PAK) 250 MG tablet 2 pills today then 1 pill a day for 4 days 02/24/23   Caryn Section Linden Dolin, PA-C  benzonatate (TESSALON PERLES) 100 MG capsule Take 1 capsule (100 mg total) by mouth 3 (three) times daily as needed for cough. 02/24/23 02/24/24  Caryn Section Linden Dolin, PA-C  colchicine 0.6 MG tablet Take 1 tablet (0.6 mg total) by mouth daily. Take 1 tab daily for at least 6 more days. If  Symptoms  persist past 6 days continue to use until prescription is finished 08/04/20   Cuthriell, Charline Bills, PA-C  diclofenac (VOLTAREN) 75 MG EC tablet Take 1 tablet (75 mg total) by mouth 2 (two) times daily. 07/23/20   Menshew, Dannielle Karvonen, PA-C  fexofenadine (ALLEGRA) 180 MG tablet Take 1 tablet (180 mg total) by mouth daily. 02/24/23   Fisher, Linden Dolin, PA-C  hydrochlorothiazide (MICROZIDE) 12.5 MG capsule Take 1 capsule (12.5 mg total) by mouth daily. 01/12/17   Coral Spikes, DO  HYDROcodone-acetaminophen (NORCO) 5-325 MG tablet Take 1 tablet by mouth 3 (three) times daily. 07/23/20   Menshew, Dannielle Karvonen, PA-C  sulfacetamide (BLEPH-10) 10 % ophthalmic solution Place 2 drops into both eyes 4 (four) times daily for 10 days. Discard remainder 02/24/23 03/06/23  Versie Starks, PA-C     Allergies  Patient has no known allergies.   Family History   Family History  Problem Relation Age of Onset   Breast cancer Mother    Diabetes Father      Physical Exam  Triage Vital Signs: ED Triage Vitals  Enc Vitals Group     BP 02/28/23 0136 (!) 173/117     Pulse Rate 02/28/23 0136 93     Resp 02/28/23 0136 20  Temp 02/28/23 0136 98 F (36.7 C)     Temp Source 02/28/23 0136 Oral     SpO2 02/28/23 0136 100 %     Weight 02/28/23 0135 239 lb 6.7 oz (108.6 kg)     Height 02/28/23 0135 5\' 7"  (1.702 m)     Head Circumference --      Peak Flow --      Pain Score 02/28/23 0135 0     Pain Loc --      Pain Edu? --      Excl. in [place]? --     Updated Vital Signs: BP (!) 173/117 (BP Location: Right Arm)   Pulse 93   Temp 98 F (36.7 C) (Oral)   Resp 20   Ht 5\' 7"  (1.702 m)   Wt 108.6 kg   SpO2 100%   BMI 37.50 kg/m    General: Awake, no distress.  CV:  RRR.  Good peripheral perfusion.  Resp:  Normal effort.  CTAB. Abd:  No distention.  Other:  Short, stout neck.   ED Results / Procedures / Treatments  Labs (all labs ordered are listed, but only abnormal results are displayed) Labs  Reviewed - No data to display   EKG  None   RADIOLOGY None   Official radiology report(s): No results found.   PROCEDURES:  Critical Care performed: No  Procedures   MEDICATIONS ORDERED IN ED: Medications  amLODipine (NORVASC) tablet 10 mg (has no administration in time range)     IMPRESSION / MDM / ASSESSMENT AND PLAN / ED COURSE  I reviewed the triage vital signs and the nursing notes.                             52 year old male presenting with concerns for OSA.  I personally reviewed patient's ED visit from several days ago including his negative chest x-ray.  Will refer him to pulmonology for follow-up and probable sleep study.  Will restart his Amlodipine 10mg  daily.  Strict return precautions given.  Patient verbalizes understanding and agrees with plan of care.  Patient's presentation is most consistent with acute, uncomplicated illness.   FINAL CLINICAL IMPRESSION(S) / ED DIAGNOSES   Final diagnoses:  Hypertension, unspecified type  OSA (obstructive sleep apnea)     Rx / DC Orders   ED Discharge Orders     None        Note:  This document was prepared using Dragon voice recognition software and may include unintentional dictation errors.   Paulette Blanch, MD 02/28/23 [phone]

## 2023-03-19 ENCOUNTER — Encounter: Payer: Self-pay | Admitting: Emergency Medicine

## 2023-03-19 ENCOUNTER — Emergency Department: Payer: Medicaid Other

## 2023-03-19 ENCOUNTER — Other Ambulatory Visit: Payer: Self-pay

## 2023-03-19 ENCOUNTER — Emergency Department
Admission: EM | Admit: 2023-03-19 | Discharge: 2023-03-19 | Disposition: A | Discharge status: Home / Self Care | Payer: Medicaid Other | Attending: Emergency Medicine | Admitting: Emergency Medicine

## 2023-03-19 DIAGNOSIS — J189 Pneumonia, unspecified organism: Secondary | ICD-10-CM | POA: Insufficient documentation

## 2023-03-19 MED ORDER — BENZONATATE 100 MG PO CAPS: 100.0000 mg | ORAL_CAPSULE | Freq: Three times a day (TID) | ORAL | 0 refills | Status: DC | PRN

## 2023-03-19 MED ORDER — PREDNISONE 50 MG PO TABS: 50.0000 mg | ORAL_TABLET | Freq: Every day | ORAL | 0 refills | Status: DC

## 2023-03-19 MED ORDER — DOXYCYCLINE HYCLATE 100 MG PO TABS: 100.0000 mg | ORAL_TABLET | Freq: Two times a day (BID) | ORAL | 0 refills | Status: AC

## 2023-03-19 MED ORDER — PSEUDOEPH-BROMPHEN-DM 30-2-10 MG/5ML PO SYRP: 10.0000 mL | ORAL_SOLUTION | Freq: Four times a day (QID) | ORAL | 0 refills | Status: AC | PRN

## 2023-03-19 MED ORDER — ALBUTEROL SULFATE HFA 108 (90 BASE) MCG/ACT IN AERS: 2.0000 | INHALATION_SPRAY | Freq: Four times a day (QID) | RESPIRATORY_TRACT | 2 refills | Status: DC | PRN

## 2023-03-19 MED ORDER — DOXYCYCLINE HYCLATE 100 MG PO TABS
100.0000 mg | ORAL_TABLET | Freq: Once | ORAL | Status: AC
  Administered 2023-03-19: 100 mg via ORAL
  Filled 2023-03-19: qty 1

## 2023-03-19 NOTE — ED Triage Notes (Signed)
Patient reports cough x 1 month.  Patient spoke with triage nurse line who felt that he needed to come back to ER. Patient seen last month for same.

## 2023-03-19 NOTE — ED Provider Notes (Signed)
[facility] Provider Note  Patient Contact: 10:52 PM (approximate)   History   Cough   HPI  Theodore Morton is a 51 y.o. male who presents emergency department complaining of ongoing cough.  Patient had a viral illness a month ago, had negative COVID, flu, chest x-ray at that time.  All his symptoms minus cough improved.  He still has a dry nonproductive cough.  No fever.  Patient states that sometimes he will cough until he feels short of breath but is not short of breath while not coughing.  No chest pain.  No other complaints.     Physical Exam   Triage Vital Signs: ED Triage Vitals  Enc Vitals Group     BP 03/19/23 2133 (!) 145/104     Pulse Rate 03/19/23 2133 (!) 105     Resp 03/19/23 2133 20     Temp 03/19/23 2133 98.8 F (37.1 C)     Temp Source 03/19/23 2133 Oral     SpO2 03/19/23 2133 99 %     Weight 03/19/23 2134 270 lb (122.5 kg)     Height 03/19/23 2134 5\' 7"  (1.702 m)     Head Circumference --      Peak Flow --      Pain Score 03/19/23 2134 0     Pain Loc --      Pain Edu? --      Excl. in [place]? --     Most recent vital signs: Vitals:   03/19/23 2133  BP: (!) 145/104  Pulse: (!) 105  Resp: 20  Temp: 98.8 F (37.1 C)  SpO2: 99%     General: Alert and in no acute distress. ENT:      Ears:       Nose: No congestion/rhinnorhea.      Mouth/Throat: Mucous membranes are moist.  Cardiovascular:  Good peripheral perfusion Respiratory: Normal respiratory effort without tachypnea or retractions. Lungs CTAB. Good air entry to the bases with no decreased or absent breath sounds Musculoskeletal: Full range of motion to all extremities.  Neurologic:  No gross focal neurologic deficits are appreciated.  Skin:   No rash noted Other:   ED Results / Procedures / Treatments   Labs (all labs ordered are listed, but only abnormal results are displayed) Labs Reviewed - No data to display   EKG     RADIOLOGY  I personally viewed,  evaluated, and interpreted these images as part of my medical decision making, as well as reviewing the written report by the radiologist.  ED Provider Interpretation: No acute cardiopulmonary findings  DG Chest 2 View  Result Date: 03/19/2023 CLINICAL DATA:  Cough for 1 month EXAM: CHEST - 2 VIEW COMPARISON:  02/24/2023 FINDINGS: The heart size and mediastinal contours are within normal limits. Both lungs are clear. The visualized skeletal structures are unremarkable. IMPRESSION: No active cardiopulmonary disease. Electronically Signed   By: Minerva Fester M.D.   On: 03/19/2023 21:53    PROCEDURES:  Critical Care performed: No  Procedures   MEDICATIONS ORDERED IN ED: Medications  doxycycline (VIBRA-TABS) tablet 100 mg (has no administration in time range)     IMPRESSION / MDM / ASSESSMENT AND PLAN / ED COURSE  I reviewed the triage vital signs and the nursing notes.                                 Differential diagnosis  includes, but is not limited to, Communicare pneumonia, bronchitis, viral illness, COVID, flu, RSV    Patient's presentation is most consistent with acute presentation with potential threat to life or bodily function.   Patient's diagnosis is consistent with commune acquired pneumonia.  Patient presents to the emergency department with ongoing cough.  He had a virus a month ago, seemed to improve with the exception of the cough.  Patient has a nonproductive cough.  X-ray revealed no lobar consolidation.  Given the symptoms I suspect community-acquired pneumonia likely mycoplasma.  Patient also could have a touch of bronchitis that there is no peribronchial thickening and given the length I suspect commune acquired pneumonia.  As such I will treat with prednisone, albuterol, cough medication and antibiotics.  Concerning signs and symptoms and return precautions discussed with the patient.  Follow-up primary care as needed..  Patient is given ED precautions to return  to the ED for any worsening or new symptoms.     FINAL CLINICAL IMPRESSION(S) / ED DIAGNOSES   Final diagnoses:  Community acquired pneumonia, unspecified laterality     Rx / DC Orders   ED Discharge Orders          Ordered    predniSONE (DELTASONE) 50 MG tablet  Daily with breakfast        03/19/23 2249    albuterol (VENTOLIN HFA) 108 (90 Base) MCG/ACT inhaler  Every 6 hours PRN        03/19/23 2249    doxycycline (VIBRA-TABS) 100 MG tablet  2 times daily        03/19/23 2249    benzonatate (TESSALON PERLES) 100 MG capsule  3 times daily PRN        03/19/23 2249    brompheniramine-pseudoephedrine-DM 30-2-10 MG/5ML syrup  4 times daily PRN        03/19/23 2249             Note:  This document was prepared using Dragon voice recognition software and may include unintentional dictation errors.   Lanette Hampshire 03/19/23 2255    Jene Every, MD 03/20/23 1501

## 2023-08-08 ENCOUNTER — Emergency Department (HOSPITAL_COMMUNITY)
Admission: EM | Admit: 2023-08-08 | Discharge: 2023-08-08 | Disposition: A | Discharge status: Home / Self Care | Payer: Medicaid Other | Attending: Emergency Medicine | Admitting: Emergency Medicine

## 2023-08-08 DIAGNOSIS — U071 COVID-19: Secondary | ICD-10-CM | POA: Insufficient documentation

## 2023-08-08 LAB — RESP PANEL BY RT-PCR (RSV, FLU A&B, COVID)  RVPGX2
Influenza A by PCR: NEGATIVE
Influenza B by PCR: NEGATIVE
Resp Syncytial Virus by PCR: NEGATIVE
SARS Coronavirus 2 by RT PCR: POSITIVE — AB

## 2023-08-08 MED ORDER — ACETAMINOPHEN 500 MG PO TABS
1000.0000 mg | ORAL_TABLET | Freq: Once | ORAL | Status: AC
  Administered 2023-08-08: 1000 mg via ORAL
  Filled 2023-08-08: qty 2

## 2023-08-08 MED ORDER — BENZONATATE 100 MG PO CAPS: 100.0000 mg | ORAL_CAPSULE | Freq: Three times a day (TID) | ORAL | 0 refills | Status: DC

## 2023-08-08 NOTE — ED Provider Notes (Signed)
[place] EMERGENCY DEPARTMENT AT [facility] Provider Note   CSN: [id number] Arrival date & time: 08/08/23  1556     History Chief Complaint  Patient presents with   Nasal Congestion   Fatigue   Fever   Cough   Headache   Sore Throat    Theodore Morton is a 52 y.o. male. Patient presented to the ED with concerns of nasal congestion and fever. No obvious sick contacts at home. States that he has not taken much of any medications at home for symptom control. Subjective fevers and chills but denies any measured temperature at home.  Denies any chest pain, shortness of breath, Donnell pain, nausea, vomiting.   Fever Associated symptoms: cough and headaches   Cough Associated symptoms: fever and headaches   Headache Associated symptoms: cough and fever   Sore Throat Associated symptoms include headaches.       Home Medications Prior to Admission medications   Medication Sig Start Date End Date Taking? Authorizing Provider  benzonatate (TESSALON) 100 MG capsule Take 1 capsule (100 mg total) by mouth every 8 (eight) hours. 08/08/23  Yes Smitty Knudsen, PA-C  albuterol (VENTOLIN HFA) 108 (90 Base) MCG/ACT inhaler Inhale 2 puffs into the lungs every 6 (six) hours as needed for wheezing or shortness of breath. 03/19/23   Cuthriell, Delorise Royals, PA-C  amLODipine (NORVASC) 10 MG tablet Take 1 tablet (10 mg total) by mouth daily. 02/28/23 03/30/23  Irean Hong, MD  atorvastatin (LIPITOR) 20 MG tablet Take 20 mg by mouth daily.    [provider]  azithromycin (ZITHROMAX Z-PAK) 250 MG tablet 2 pills today then 1 pill a day for 4 days 02/24/23   Sherrie Mustache Roselyn Bering, PA-C  brompheniramine-pseudoephedrine-DM 30-2-10 MG/5ML syrup Take 10 mLs by mouth 4 (four) times daily as needed. 03/19/23   Cuthriell, Delorise Royals, PA-C  colchicine 0.6 MG tablet Take 1 tablet (0.6 mg total) by mouth daily. Take 1 tab daily for at least 6 more days. If  Symptoms persist past 6 days continue to use  until prescription is finished 08/04/20   Cuthriell, Delorise Royals, PA-C  diclofenac (VOLTAREN) 75 MG EC tablet Take 1 tablet (75 mg total) by mouth 2 (two) times daily. 07/23/20   Menshew, Charlesetta Ivory, PA-C  doxycycline (VIBRA-TABS) 100 MG tablet Take 1 tablet (100 mg total) by mouth 2 (two) times daily. 03/19/23   Cuthriell, Delorise Royals, PA-C  fexofenadine (ALLEGRA) 180 MG tablet Take 1 tablet (180 mg total) by mouth daily. 02/24/23   Fisher, Roselyn Bering, PA-C  hydrochlorothiazide (MICROZIDE) 12.5 MG capsule Take 1 capsule (12.5 mg total) by mouth daily. 01/12/17   Tommie Sams, DO  HYDROcodone-acetaminophen (NORCO) 5-325 MG tablet Take 1 tablet by mouth 3 (three) times daily. 07/23/20   Menshew, Charlesetta Ivory, PA-C  predniSONE (DELTASONE) 50 MG tablet Take 1 tablet (50 mg total) by mouth daily with breakfast. 03/19/23   Cuthriell, Delorise Royals, PA-C      Allergies    Patient has no known allergies.    Review of Systems   Review of Systems  Constitutional:  Positive for fever.  Respiratory:  Positive for cough.   Neurological:  Positive for headaches.  All other systems reviewed and are negative.   Physical Exam Updated Vital Signs BP 130/80 (BP Location: Right Arm)   Pulse 98   Temp 99.1 F (37.3 C) (Oral)   Resp 18   SpO2 98%  Physical Exam Vitals and nursing  note reviewed.  Constitutional:      General: He is not in acute distress.    Appearance: He is well-developed.  HENT:     Head: Normocephalic and atraumatic.  Eyes:     Conjunctiva/sclera: Conjunctivae normal.  Cardiovascular:     Rate and Rhythm: Regular rhythm. Tachycardia present.     Heart sounds: No murmur heard. Pulmonary:     Effort: Pulmonary effort is normal. No respiratory distress.     Breath sounds: Normal breath sounds. No wheezing or rales.  Abdominal:     Palpations: Abdomen is soft.     Tenderness: There is no abdominal tenderness.  Musculoskeletal:        General: No swelling.     Cervical back: Neck  supple.  Skin:    General: Skin is warm and dry.     Capillary Refill: Capillary refill takes less than 2 seconds.  Neurological:     Mental Status: He is alert.  Psychiatric:        Mood and Affect: Mood normal.     ED Results / Procedures / Treatments   Labs (all labs ordered are listed, but only abnormal results are displayed) Labs Reviewed  RESP PANEL BY RT-PCR (RSV, FLU A&B, COVID)  RVPGX2 - Abnormal; Notable for the following components:      Result Value   SARS Coronavirus 2 by RT PCR POSITIVE (*)    All other components within normal limits    EKG None  Radiology No results found.  Procedures Procedures   Medications Ordered in ED Medications  acetaminophen (TYLENOL) tablet 1,000 mg (1,000 mg Oral Given 08/08/23 1925)    ED Course/ Medical Decision Making/ A&P                               Medical Decision Making Risk OTC drugs. Prescription drug management.   This patient presents to the ED for concern of viral URI.  Differential diagnosis includes 19, influenza, RSV, pneumonia, bronchitis   Lab Tests:  I Ordered, and personally interpreted labs.  The pertinent results include: Respiratory viral panel positive for COVID-19 but negative for influenza RSV   Medicines ordered and prescription drug management:  I ordered medication including Tylenol for fever Reevaluation of the patient after these medicines showed that the patient improved I have reviewed the patients home medicines and have made adjustments as needed   Problem List / ED Course:  Patient presents to the emergency department concerns of of nasal congestion, subjective fevers, headache, and fatigue.  Reports that the symptoms have been ongoing for several days without improvement.  States that he is not sure of any sick contacts that he is coming to contact with.  No one is sick at home as far as he aware.  No significant past medical history with exception of hypertension.  Given  combination of symptoms, order respiratory viral panel for assessment of symptoms.  Patient is febrile at 100.5 F in triage.  Will order a dose of Tylenol for management of this.  Denies any chest pain shortness of breath at this time.  Although patient is febrile and tachycardic, patient is not septic appearing so septic order set was not placed. Respiratory viral panel is positive for COVID-19.  Informed patient of this finding and discussed possibility of use of antiviral medications.  Given lack of significant risk factors with exception of hypertension, decided against use of COVID-19 antiviral medications.  Send advise management of symptoms at home with symptomatic treatment such as Tylenol, Profen, Aleve for fevers or pain, Mucinex for congestion, or cough medication.  A prescription for Tessalon was sent to patient's pharmacy for cough.  At this time a low concern for any acute or more concerning pathology as patient appears stable and comfortable.  Likely symptoms are all related to respiratory viral process from COVID-19.  Strict return precautions discussed.  All questions answered prior to patient discharge.  Final Clinical Impression(s) / ED Diagnoses Final diagnoses:  COVID-19    Rx / DC Orders ED Discharge Orders          Ordered    benzonatate (TESSALON) 100 MG capsule  Every 8 hours        08/08/23 1917              Smitty Knudsen, PA-C 08/09/23 0016    Glyn Ade, MD 08/09/23 1455

## 2023-08-08 NOTE — Discharge Instructions (Signed)
You were seen in the ER for nasal congestion, head pain, and fatigue. You were diagnosed with COVID-19 based on viral testing. Please manage your symptoms at home with over the counter medications for fever, congestion, and cough. I have sent a prescription for cough medicine to your pharmacy for management of your cough. If concerned that symptoms are worsening, return to the ER, otherwise follow up with your primary care provider.

## 2023-12-07 ENCOUNTER — Emergency Department
Admission: EM | Admit: 2023-12-07 | Discharge: 2023-12-07 | Disposition: A | Discharge status: Home / Self Care | Payer: Self-pay | Attending: Emergency Medicine | Admitting: Emergency Medicine

## 2023-12-07 ENCOUNTER — Other Ambulatory Visit: Payer: Self-pay

## 2023-12-07 ENCOUNTER — Encounter: Payer: Self-pay | Admitting: Emergency Medicine

## 2023-12-07 DIAGNOSIS — B338 Other specified viral diseases: Secondary | ICD-10-CM

## 2023-12-07 DIAGNOSIS — Z20822 Contact with and (suspected) exposure to covid-19: Secondary | ICD-10-CM | POA: Insufficient documentation

## 2023-12-07 DIAGNOSIS — B974 Respiratory syncytial virus as the cause of diseases classified elsewhere: Secondary | ICD-10-CM | POA: Insufficient documentation

## 2023-12-07 DIAGNOSIS — R051 Acute cough: Secondary | ICD-10-CM | POA: Insufficient documentation

## 2023-12-07 DIAGNOSIS — I10 Essential (primary) hypertension: Secondary | ICD-10-CM | POA: Insufficient documentation

## 2023-12-07 LAB — RESP PANEL BY RT-PCR (RSV, FLU A&B, COVID)  RVPGX2
Influenza A by PCR: NEGATIVE
Influenza B by PCR: NEGATIVE
Resp Syncytial Virus by PCR: POSITIVE — AB
SARS Coronavirus 2 by RT PCR: NEGATIVE

## 2023-12-07 MED ORDER — DEXTROMETHORPHAN HBR 15 MG/5ML PO SYRP: 10.0000 mL | ORAL_SOLUTION | Freq: Four times a day (QID) | ORAL | 0 refills | Status: AC | PRN

## 2023-12-07 NOTE — ED Triage Notes (Signed)
 Patient to ED via POV for cough since Monday. States also having runny nose and headache.

## 2023-12-07 NOTE — Discharge Instructions (Addendum)
 You were seen in the emergency department today for a cough. Your RSV is positive. Please see patient education packet attached to your discharge papers.   A viral cough may last up to 2-3 weeks.   Take tylenol  or ibuprofen for pain or fever as directed.   Stay hydrated by drinking plenty of fluids to thin mucus. Get adequate amount of sleep and avoid overexertion. Consider a humidifier at night. Warm teas and a spoonful of honey may help reduce cough frequency. Follow up with your primary care provider as needed.   Use throat lozenges or Chloraseptic spray for sore throat.  Gargle with warm salt water several times daily  Monitor symptoms as discussed. Hand hygiene and wearing a mask can reduce transmission.

## 2023-12-07 NOTE — ED Provider Notes (Signed)
 [facility] Emergency Department Provider Note     Event Date/Time   First MD Initiated Contact with Patient 12/07/23 1630     (approximate)   History   Cough   HPI  Theodore Morton is a 53 y.o. male with a history of HTN, anxiety and anemia presents to the ED for evaluation of dry cough x 3 days.  Denies fever.  Unknown sick contacts.  Has tried DayQuil and aspirin with minimal relief.     Physical Exam   Triage Vital Signs: ED Triage Vitals  Encounter Vitals Group     BP 12/07/23 1418 (!) 144/114     Systolic BP Percentile --      Diastolic BP Percentile --      Pulse Rate 12/07/23 1417 93     Resp 12/07/23 1417 18     Temp 12/07/23 1417 98.6 F (37 C)     Temp Source 12/07/23 1417 Oral     SpO2 12/07/23 1417 95 %     Weight 12/07/23 1418 210 lb (95.3 kg)     Height 12/07/23 1418 5' 7 (1.702 m)     Head Circumference --      Peak Flow --      Pain Score 12/07/23 1418 0     Pain Loc --      Pain Education --      Exclude from Growth Chart --     Most recent vital signs: Vitals:   12/07/23 1417 12/07/23 1418  BP:  (!) 144/114  Pulse: 93 100  Resp: 18 18  Temp: 98.6 F (37 C) 98.6 F (37 C)  SpO2: 95% 98%   General: Well appearing. Alert and oriented. INAD.  Skin:  Warm, dry and intact. No rashes or lesions noted.     Head:  NCAT.  Eyes:  PERRLA. EOMI.  Nose:   Mucosa is moist. No rhinorrhea. CV:  Good peripheral perfusion. RRR.  RESP:  Normal effort. LCTAB. No retractions.  ABD:  No distention. Soft, Non tender.  MSK:   Full ROM in all joints. No swelling, deformity or tenderness.  NEURO: Cranial nerves II-XII intact. No focal deficits.   ED Results / Procedures / Treatments   Labs (all labs ordered are listed, but only abnormal results are displayed) Labs Reviewed  RESP PANEL BY RT-PCR (RSV, FLU A&B, COVID)  RVPGX2 - Abnormal; Notable for the following components:      Result Value   Resp Syncytial Virus by PCR  POSITIVE (*)    All other components within normal limits    No results found.  PROCEDURES:  Critical Care performed: No  Procedures   MEDICATIONS ORDERED IN ED: Medications - No data to display   IMPRESSION / MDM / ASSESSMENT AND PLAN / ED COURSE  I reviewed the triage vital signs and the nursing notes.                               53 y.o. male presents to the emergency department for evaluation and treatment of cough. See HPI for further details.   Differential diagnosis includes, but is not limited to COVID, influenza, RSV  Patient's presentation is most consistent with acute complicated illness / injury requiring diagnostic workup.  Patient is alert and oriented.  He is hemodynamically stable and afebrile.  Physical exam findings are stated above and overall benign. Respiratory panel is positive for RSV.  Symptomatic treatment at home provided.  Lung exam is normal.  Patient is in stable condition for discharge home.  ED return precautions discussed.   FINAL CLINICAL IMPRESSION(S) / ED DIAGNOSES   Final diagnoses:  Acute cough  RSV (respiratory syncytial virus infection)   Rx / DC Orders   ED Discharge Orders          Ordered    dextromethorphan  15 MG/5ML syrup  4 times daily PRN        12/07/23 1703           Note:  This document was prepared using Dragon voice recognition software and may include unintentional dictation errors.    Margrette, Makani Seckman A, PA-C 12/07/23 [id number]    Suzanne Kirsch, MD 12/08/23 [phone]

## 2023-12-11 ENCOUNTER — Emergency Department: Payer: Self-pay

## 2023-12-11 ENCOUNTER — Other Ambulatory Visit: Payer: Self-pay

## 2023-12-11 ENCOUNTER — Emergency Department
Admission: EM | Admit: 2023-12-11 | Discharge: 2023-12-11 | Disposition: A | Discharge status: Home / Self Care | Payer: Self-pay | Attending: Emergency Medicine | Admitting: Emergency Medicine

## 2023-12-11 DIAGNOSIS — I10 Essential (primary) hypertension: Secondary | ICD-10-CM

## 2023-12-11 DIAGNOSIS — J205 Acute bronchitis due to respiratory syncytial virus: Secondary | ICD-10-CM

## 2023-12-11 DIAGNOSIS — J210 Acute bronchiolitis due to respiratory syncytial virus: Secondary | ICD-10-CM | POA: Insufficient documentation

## 2023-12-11 MED ORDER — AMLODIPINE BESYLATE 5 MG PO TABS
10.0000 mg | ORAL_TABLET | Freq: Once | ORAL | Status: AC
  Administered 2023-12-11: 10 mg via ORAL
  Filled 2023-12-11: qty 2

## 2023-12-11 MED ORDER — AMLODIPINE BESYLATE 10 MG PO TABS: 10.0000 mg | ORAL_TABLET | Freq: Every day | ORAL | 2 refills | Status: AC

## 2023-12-11 MED ORDER — BENZONATATE 100 MG PO CAPS: 200.0000 mg | ORAL_CAPSULE | Freq: Three times a day (TID) | ORAL | 0 refills | Status: AC | PRN

## 2023-12-11 MED ORDER — ALBUTEROL SULFATE HFA 108 (90 BASE) MCG/ACT IN AERS: 2.0000 | INHALATION_SPRAY | Freq: Four times a day (QID) | RESPIRATORY_TRACT | 2 refills | Status: AC | PRN

## 2023-12-11 NOTE — ED Provider Notes (Signed)
 [facility] Provider Note   Event Date/Time   First MD Initiated Contact with Patient 12/11/23 2005     (approximate)  History   Cough   HPI  Theodore Morton is a 54 y.o. male   reports he has had a cough since about Tuesday of last week.  Along with disease at congestion.  He was diagnosed with RSV a few days ago.  Occasionally has had some wheezing but slightly productive.  He continues to have symptoms of coughing, he works at [employer], and he tells me that he feels like he may need to be out of work for a couple more days as he still has symptoms.  Overall his symptoms are starting to feel better, he has no fever no chills no shortness of breath.  He also has a history of high blood pressure and reports that he has run out of his Norvasc  which he typically use daily.  He is not having any chest pain, shortness of breath numbness or weakness  He reports he ran out a little while ago and does not currently have a primary care doctor      Physical Exam   Triage Vital Signs: ED Triage Vitals  Encounter Vitals Group     BP 12/11/23 1716 (!) 168/118     Systolic BP Percentile --      Diastolic BP Percentile --      Pulse Rate 12/11/23 1716 (!) 104     Resp 12/11/23 1716 20     Temp 12/11/23 1716 98.5 F (36.9 C)     Temp src --      SpO2 12/11/23 1716 99 %     Weight 12/11/23 1719 210 lb (95.3 kg)     Height 12/11/23 1719 5' 7 (1.702 m)     Head Circumference --      Peak Flow --      Pain Score 12/11/23 1719 3     Pain Loc --      Pain Education --      Exclude from Growth Chart --     Most recent vital signs: Vitals:   12/11/23 1716  BP: (!) 168/118  Pulse: (!) 104  Resp: 20  Temp: 98.5 F (36.9 C)  SpO2: 99%     General: Awake, no distress.  CV:  Good peripheral perfusion.  Normal tones and rate heart rate approximately 90 Resp:  Normal effort.  Clear bilateral.  Occasional dry cough.  No wheezing.  His work of breathing is normal  without accessory muscle use. Abd:  No distention.  Soft nontender Other:  No noted edema   ED Results / Procedures / Treatments   Labs (all labs ordered are listed, but only abnormal results are displayed) Labs Reviewed - No data to display   RADIOLOGY Chest x-ray interpreted by me as negative for acute finding    PROCEDURES:  Critical Care performed: No  Procedures   MEDICATIONS ORDERED IN ED: Medications  amLODipine  (NORVASC ) tablet 10 mg (has no administration in time range)     IMPRESSION / MDM / ASSESSMENT AND PLAN / ED COURSE  I reviewed the triage vital signs and the nursing notes.                              Differential diagnosis includes, but is not limited to, likely RSV bronchitis, pneumonia, influenza COVID upper respiratory syndrome acute bronchitis  etc.  Chest x-ray clear, afebrile reassuring workup.  He is awake alert nontoxic and actually reports his symptoms are slowly improving but still having some persistence of cough.  Discussed with patient he has previously taken Tessalon , will prescribe Tessalon , also albuterol  MDI as needed if he is experiencing frequent cough or slight wheezing.  Was recently treated with steroid  Awake alert nontoxic.  Appears appropriate for ongoing outpatient treatment which she is agreeable with.  Suspect his hypertension is unrelated to his presentation today he has longstanding hypertension that is currently not treated and uncontrolled.  Made referral to primary care referral service, and refilled his Norvasc .  Patient agreeable with this plan.  Will seek a primary care as well and gave additional months of prescription.  No signs or symptoms of hypertensive emergency or associated findings or chest pain  Patient's presentation is most consistent with acute complicated illness / injury requiring diagnostic workup.   Return precautions and treatment recommendations and follow-up discussed with the patient who is  agreeable with the plan.        FINAL CLINICAL IMPRESSION(S) / ED DIAGNOSES   Final diagnoses:  Acute bronchitis due to respiratory syncytial virus (RSV)  Uncontrolled hypertension     Rx / DC Orders   ED Discharge Orders          Ordered    amLODipine  (NORVASC ) 10 MG tablet  Daily        12/11/23 2018    benzonatate  (TESSALON ) 100 MG capsule  3 times daily PRN        12/11/23 2018    albuterol  (VENTOLIN  HFA) 108 (90 Base) MCG/ACT inhaler  Every 6 hours PRN        12/11/23 2018    Ambulatory Referral to Primary Care (Establish Care)       Comments: Needs pcp, uncontrolled hypertension   12/11/23 2020             Note:  This document was prepared using Dragon voice recognition software and may include unintentional dictation errors.   Dicky Anes, MD 12/11/23 2034

## 2023-12-11 NOTE — ED Notes (Signed)
 Patient discharged from ED by provider. Discharge instructions reviewed with patient and all questions answered. Patient ambulatory from ED in NAD.

## 2023-12-11 NOTE — ED Provider Triage Note (Signed)
 Emergency Medicine Provider Triage Evaluation Note  Landers Prajapati , a 53 y.o. male  was evaluated in triage.  Pt complains of cough. Was seen on 12/07/2023 for same complaint. +RSV. Patient reports continuing cough with no improvement and wheezing.  Denies cp and fever.   Review of Systems  Positive:  Negative:   Physical Exam  BP (!) 168/118   Pulse (!) 104   Temp 98.5 F (36.9 C)   Resp 20   Ht 5' 7 (1.702 m)   Wt 95.3 kg   SpO2 99%   BMI 32.89 kg/m  Gen:   Awake, no distress   Resp:  Normal effort LCTAB MSK:   Moves extremities without difficulty Other:    Medical Decision Making  Medically screening exam initiated at 5:26 PM.  Appropriate orders placed.  Paden Senger was informed that the remainder of the evaluation will be completed by another provider, this initial triage assessment does not replace that evaluation, and the importance of remaining in the ED until their evaluation is complete.     Margrette, Farrie Sann A, PA-C 12/11/23 1727

## 2023-12-11 NOTE — ED Triage Notes (Signed)
 Pt sts that he has been coughing and having wheezing for the last several days. Pt sts that his family is concerned.

## 2023-12-22 ENCOUNTER — Other Ambulatory Visit: Payer: Self-pay

## 2023-12-22 ENCOUNTER — Emergency Department
Admission: EM | Admit: 2023-12-22 | Discharge: 2023-12-22 | Disposition: A | Discharge status: Home / Self Care | Payer: Self-pay | Attending: Emergency Medicine | Admitting: Emergency Medicine

## 2023-12-22 ENCOUNTER — Emergency Department: Payer: Self-pay

## 2023-12-22 DIAGNOSIS — Z20822 Contact with and (suspected) exposure to covid-19: Secondary | ICD-10-CM | POA: Insufficient documentation

## 2023-12-22 DIAGNOSIS — J101 Influenza due to other identified influenza virus with other respiratory manifestations: Secondary | ICD-10-CM | POA: Insufficient documentation

## 2023-12-22 DIAGNOSIS — B349 Viral infection, unspecified: Secondary | ICD-10-CM | POA: Insufficient documentation

## 2023-12-22 DIAGNOSIS — J111 Influenza due to unidentified influenza virus with other respiratory manifestations: Secondary | ICD-10-CM

## 2023-12-22 LAB — COMPREHENSIVE METABOLIC PANEL
ALT: 28 U/L (ref 0–44)
AST: 23 U/L (ref 15–41)
Albumin: 4.6 g/dL (ref 3.5–5.0)
Alkaline Phosphatase: 52 U/L (ref 38–126)
Anion gap: 10 (ref 5–15)
BUN: 15 mg/dL (ref 6–20)
CO2: 22 mmol/L (ref 22–32)
Calcium: 9.3 mg/dL (ref 8.9–10.3)
Chloride: 102 mmol/L (ref 98–111)
Creatinine, Ser: 1.65 mg/dL — ABNORMAL HIGH (ref 0.61–1.24)
GFR, Estimated: 49 mL/min — ABNORMAL LOW (ref >60)
Glucose, Bld: 105 mg/dL — ABNORMAL HIGH (ref 70–99)
Potassium: 4.4 mmol/L (ref 3.5–5.1)
Sodium: 134 mmol/L — ABNORMAL LOW (ref 135–145)
Total Bilirubin: 0.6 mg/dL (ref 0.0–1.2)
Total Protein: 8.6 g/dL — ABNORMAL HIGH (ref 6.5–8.1)

## 2023-12-22 LAB — URINALYSIS, ROUTINE W REFLEX MICROSCOPIC
Bacteria, UA: NONE SEEN
Bilirubin Urine: NEGATIVE
Glucose, UA: NEGATIVE mg/dL
Ketones, ur: NEGATIVE mg/dL
Leukocytes,Ua: NEGATIVE
Nitrite: NEGATIVE
Protein, ur: 30 mg/dL — AB
Specific Gravity, Urine: 1.019 (ref 1.005–1.030)
pH: 5 (ref 5.0–8.0)

## 2023-12-22 LAB — CBC
HCT: 39.5 % (ref 39.0–52.0)
Hemoglobin: 12.2 g/dL — ABNORMAL LOW (ref 13.0–17.0)
MCH: 28.2 pg (ref 26.0–34.0)
MCHC: 30.9 g/dL (ref 30.0–36.0)
MCV: 91.4 fL (ref 80.0–100.0)
Platelets: 374 10*3/uL (ref 150–400)
RBC: 4.32 MIL/uL (ref 4.22–5.81)
RDW: 11.5 % (ref 11.5–15.5)
WBC: 8.2 10*3/uL (ref 4.0–10.5)
nRBC: 0 % (ref 0.0–0.2)

## 2023-12-22 LAB — RESP PANEL BY RT-PCR (RSV, FLU A&B, COVID)  RVPGX2
Influenza A by PCR: POSITIVE — AB
Influenza B by PCR: NEGATIVE
Resp Syncytial Virus by PCR: NEGATIVE
SARS Coronavirus 2 by RT PCR: NEGATIVE

## 2023-12-22 LAB — LIPASE, BLOOD: Lipase: 26 U/L (ref 11–51)

## 2023-12-22 MED ORDER — KETOROLAC TROMETHAMINE 30 MG/ML IJ SOLN
30.0000 mg | Freq: Once | INTRAMUSCULAR | Status: AC
  Administered 2023-12-22: 30 mg via INTRAMUSCULAR
  Filled 2023-12-22: qty 1

## 2023-12-22 MED ORDER — OSELTAMIVIR PHOSPHATE 75 MG PO CAPS: 75.0000 mg | ORAL_CAPSULE | Freq: Two times a day (BID) | ORAL | 0 refills | Status: AC

## 2023-12-22 MED ORDER — ACETAMINOPHEN 500 MG PO TABS
1000.0000 mg | ORAL_TABLET | Freq: Once | ORAL | Status: AC
  Administered 2023-12-22: 1000 mg via ORAL

## 2023-12-22 MED ORDER — ACETAMINOPHEN 500 MG PO TABS
ORAL_TABLET | ORAL | Status: AC
  Filled 2023-12-22: qty 2

## 2023-12-22 MED ORDER — ONDANSETRON 4 MG PO TBDP: 4.0000 mg | ORAL_TABLET | Freq: Three times a day (TID) | ORAL | 0 refills | Status: AC | PRN

## 2023-12-22 NOTE — Discharge Instructions (Addendum)
You were seen in the emergency department with fever and not feeling well.  Your positive for influenza which is the flu.  You are called in an antiviral medicine called Tamiflu.  You are also called in a nausea medication.  If you start taking this medication and start feeling worse with worsening nausea, vomiting or diarrhea discontinue this medication.  Increase your oral hydration.  You can alternate Motrin and Tylenol for fever and pain control.  Return for any worsening symptoms or signs of dehydration.  You are given information to follow-up and establish care with a primary care provider.  Thank you for choosing [place] for your health care, it was my pleasure to care for you today!  Corena Herter, MD

## 2023-12-22 NOTE — ED Provider Triage Note (Signed)
Emergency Medicine Provider Triage Evaluation Note  Theodore Morton , a 53 y.o. male  was evaluated in triage.  Pt complains of cough x 1 month, unchanged. He reports having 3 episodes of diarrhea yesterday morning. No abdominal pain or vomiting   Review of Systems  Positive: fever Negative:   Physical Exam  BP (!) 135/97   Pulse (!) 134   Temp (!) 103.1 F (39.5 C) (Oral)   Resp (!) 22   Ht 5\' 7"  (1.702 m)   Wt 95.3 kg   SpO2 94%   BMI 32.91 kg/m  Gen:   Awake, no distress   Resp:  Normal effort  MSK:   Moves extremities without difficulty  Other:    Medical Decision Making  Medically screening exam initiated at 1:16 PM.  Appropriate orders placed.  Theodore Morton was informed that the remainder of the evaluation will be completed by another provider, this initial triage assessment does not replace that evaluation, and the importance of remaining in the ED until their evaluation is complete.     Theodore Morton, Theodore Morton A, PA-C 12/22/23 1321

## 2023-12-22 NOTE — ED Provider Notes (Signed)
[facility] Provider Note    Event Date/Time   First MD Initiated Contact with Patient 12/22/23 1554     (approximate)   History   Cough and Fever   HPI  Theodore Morton is a 53 y.o. male presents to the emergency department with cough and fever.  States that he had RSV earlier this year and was starting to feel better and then again started feel sick again with high fever.  Endorses cough with some shortness of breath.  Endorses multiple episodes of diarrhea.  Denies any nausea or vomiting.  Febrile on arrival.  Took Tylenol.  States he is feeling slightly better.  Denies any significant tobacco use.  Denies any abdominal pain.  Denies any pain or shortness of breath.     Physical Exam   Triage Vital Signs: ED Triage Vitals  Encounter Vitals Group     BP 12/22/23 1308 (!) 135/97     Systolic BP Percentile --      Diastolic BP Percentile --      Pulse Rate 12/22/23 1308 (!) 134     Resp 12/22/23 1308 (!) 22     Temp 12/22/23 1309 (!) 103.1 F (39.5 C)     Temp Source 12/22/23 1309 Oral     SpO2 12/22/23 1308 94 %     Weight 12/22/23 1309 210 lb 1.6 oz (95.3 kg)     Height 12/22/23 1309 5\' 7"  (1.702 m)     Head Circumference --      Peak Flow --      Pain Score 12/22/23 1309 0     Pain Loc --      Pain Education --      Exclude from Growth Chart --     Most recent vital signs: Vitals:   12/22/23 1309 12/22/23 1629  BP:  106/84  Pulse:  91  Resp:  18  Temp: (!) 103.1 F (39.5 C) 99.1 F (37.3 C)  SpO2:  96%    Physical Exam Constitutional:      Appearance: He is well-developed.  HENT:     Head: Atraumatic.  Eyes:     Extraocular Movements: Extraocular movements intact.     Conjunctiva/sclera: Conjunctivae normal.     Pupils: Pupils are equal, round, and reactive to light.  Cardiovascular:     Rate and Rhythm: Regular rhythm.  Pulmonary:     Effort: No respiratory distress.     Breath sounds: No wheezing or rhonchi.  Abdominal:      Tenderness: There is no abdominal tenderness.  Musculoskeletal:     Cervical back: Normal range of motion.  Skin:    General: Skin is warm.  Neurological:     Mental Status: He is alert. Mental status is at baseline.     IMPRESSION / MDM / ASSESSMENT AND PLAN / ED COURSE  I reviewed the triage vital signs and the nursing notes.  On arrival patient was febrile and tachycardic.  Normotensive.  Differential diagnosis including viral illness including COVID/influenza, pneumonia, urinary tract infection  EKG  I, Corena Herter, the attending physician, personally viewed and interpreted this ECG.  EKG consistent with sinus tachycardia.  Normal intervals.  No chamber enlargement.  No significant ST elevation or depression.  No findings of acute ischemia or dysrhythmia.  RADIOLOGY I independently reviewed imaging, my interpretation of imaging: Chest x-ray with no signs of pneumonia  LABS (all labs ordered are listed, but only abnormal results are displayed) Labs interpreted  as -    Labs Reviewed  RESP PANEL BY RT-PCR (RSV, FLU A&B, COVID)  RVPGX2 - Abnormal; Notable for the following components:      Result Value   Influenza A by PCR POSITIVE (*)    All other components within normal limits  COMPREHENSIVE METABOLIC PANEL - Abnormal; Notable for the following components:   Sodium 134 (*)    Glucose, Bld 105 (*)    Creatinine, Ser 1.65 (*)    Total Protein 8.6 (*)    GFR, Estimated 49 (*)    All other components within normal limits  CBC - Abnormal; Notable for the following components:   Hemoglobin 12.2 (*)    All other components within normal limits  URINALYSIS, ROUTINE W REFLEX MICROSCOPIC - Abnormal; Notable for the following components:   Color, Urine YELLOW (*)    APPearance CLEAR (*)    Hgb urine dipstick MODERATE (*)    Protein, ur 30 (*)    All other components within normal limits  LIPASE, BLOOD     MDM  Influenza is positive.  Mild elevation of  creatinine of 1.65.  Normal BUN.  Urine with no signs of urinary tract infection.  Clinical picture is most consistent with influenza.  After Tylenol repeat vital signs were reassuring.  No hypoxia or signs of respiratory distress.  Appears mildly dehydrated likely in the setting of his diarrhea.  Tolerating p.o.  Discussed Tamiflu and Zofran.  Given return precautions for any worsening symptoms.     PROCEDURES:  Critical Care performed: No  Procedures  Patient's presentation is most consistent with acute complicated illness / injury requiring diagnostic workup.   MEDICATIONS ORDERED IN ED: Medications  acetaminophen (TYLENOL) tablet 1,000 mg (1,000 mg Oral Given 12/22/23 1312)  ketorolac (TORADOL) 30 MG/ML injection 30 mg (30 mg Intramuscular Given 12/22/23 1646)    FINAL CLINICAL IMPRESSION(S) / ED DIAGNOSES   Final diagnoses:  Influenza  Viral illness     Rx / DC Orders   ED Discharge Orders          Ordered    oseltamivir (TAMIFLU) 75 MG capsule  2 times daily        12/22/23 1625    ondansetron (ZOFRAN-ODT) 4 MG disintegrating tablet  Every 8 hours PRN        12/22/23 1625    Ambulatory Referral to Primary Care (Establish Care)        12/22/23 1625             Note:  This document was prepared using Dragon voice recognition software and may include unintentional dictation errors.   Corena Herter, MD 12/22/23 2317

## 2023-12-22 NOTE — ED Provider Notes (Signed)
[facility] Provider Note    Event Date/Time   First MD Initiated Contact with Patient 12/22/23 1554     (approximate)   History   Cough and Fever   HPI  Theodore Morton is a 53 y.o. male presents to the emergency department for not feeling well.  States that he was sick earlier this year and tested positive for RSV complicated by bronchitis.  Started to not feel well 2 days ago.  Complaining of diarrhea with bodyaches.  When he checked into the emergency department found to have a fever.  Denies any significant nausea or vomiting.  No abdominal pain, chest pain or shortness of breath.     Physical Exam   Triage Vital Signs: ED Triage Vitals  Encounter Vitals Group     BP 12/22/23 1308 (!) 135/97     Systolic BP Percentile --      Diastolic BP Percentile --      Pulse Rate 12/22/23 1308 (!) 134     Resp 12/22/23 1308 (!) 22     Temp 12/22/23 1309 (!) 103.1 F (39.5 C)     Temp Source 12/22/23 1309 Oral     SpO2 12/22/23 1308 94 %     Weight 12/22/23 1309 210 lb 1.6 oz (95.3 kg)     Height 12/22/23 1309 5\' 7"  (1.702 m)     Head Circumference --      Peak Flow --      Pain Score 12/22/23 1309 0     Pain Loc --      Pain Education --      Exclude from Growth Chart --     Most recent vital signs: Vitals:   12/22/23 1309 12/22/23 1629  BP:  106/84  Pulse:  91  Resp:  18  Temp: (!) 103.1 F (39.5 C) 99.1 F (37.3 C)  SpO2:  96%    Physical Exam Constitutional:      Appearance: He is well-developed.  HENT:     Head: Atraumatic.  Eyes:     Conjunctiva/sclera: Conjunctivae normal.     Pupils: Pupils are equal, round, and reactive to light.  Cardiovascular:     Rate and Rhythm: Regular rhythm.  Pulmonary:     Effort: No respiratory distress.     Breath sounds: Normal breath sounds. No wheezing.  Abdominal:     Tenderness: There is no abdominal tenderness.  Musculoskeletal:     Cervical back: Normal range of motion.  Skin:     General: Skin is warm.     Capillary Refill: Capillary refill takes less than 2 seconds.  Neurological:     Mental Status: He is alert. Mental status is at baseline.     IMPRESSION / MDM / ASSESSMENT AND PLAN / ED COURSE  I reviewed the triage vital signs and the nursing notes.  Differential diagnosis including viral illness including COVID/influenza, pneumonia, electrolyte abnormality, dehydration, viral gastroenteritis  EKG  I, Corena Herter, the attending physician, personally viewed and interpreted this ECG.  Sinus tachycardia with a heart rate in the 140s.  No significant ST elevation or depression.  No signs of acute ischemia or dysrhythmia    RADIOLOGY I independently reviewed imaging, my interpretation of imaging: Chest x-ray no signs of pneumonia read as no acute findings.  LABS (all labs ordered are listed, but only abnormal results are displayed) Labs interpreted as -    Labs Reviewed  RESP PANEL BY RT-PCR (RSV, FLU A&B, COVID)  RVPGX2 - Abnormal; Notable for the following components:      Result Value   Influenza A by PCR POSITIVE (*)    All other components within normal limits  COMPREHENSIVE METABOLIC PANEL - Abnormal; Notable for the following components:   Sodium 134 (*)    Glucose, Bld 105 (*)    Creatinine, Ser 1.65 (*)    Total Protein 8.6 (*)    GFR, Estimated 49 (*)    All other components within normal limits  CBC - Abnormal; Notable for the following components:   Hemoglobin 12.2 (*)    All other components within normal limits  URINALYSIS, ROUTINE W REFLEX MICROSCOPIC - Abnormal; Notable for the following components:   Color, Urine YELLOW (*)    APPearance CLEAR (*)    Hgb urine dipstick MODERATE (*)    Protein, ur 30 (*)    All other components within normal limits  LIPASE, BLOOD     MDM  No significant leukocytosis or anemia.  Creatinine mildly elevated at 1.65 from a baseline of 1.3.  Normal BUN.  No significant electrolyte  abnormality.  Lipase normal.  Influenza was positive.  Repeat vital signs significantly improved after Tylenol.  Tolerating p.o.  Appears well-hydrated.  No signs of respiratory distress.  Given a prescription for Tamiflu and Zofran.  Discussed return precautions.  Given referral to establish care for primary care provider.     PROCEDURES:  Critical Care performed: No  Procedures  Patient's presentation is most consistent with acute presentation with potential threat to life or bodily function.   MEDICATIONS ORDERED IN ED: Medications  ketorolac (TORADOL) 30 MG/ML injection 30 mg (has no administration in time range)  acetaminophen (TYLENOL) tablet 1,000 mg (1,000 mg Oral Given 12/22/23 1312)    FINAL CLINICAL IMPRESSION(S) / ED DIAGNOSES   Final diagnoses:  Influenza  Viral illness     Rx / DC Orders   ED Discharge Orders          Ordered    oseltamivir (TAMIFLU) 75 MG capsule  2 times daily        12/22/23 1625    ondansetron (ZOFRAN-ODT) 4 MG disintegrating tablet  Every 8 hours PRN        12/22/23 1625    Ambulatory Referral to Primary Care (Establish Care)        12/22/23 1625             Note:  This document was prepared using Dragon voice recognition software and may include unintentional dictation errors.   Corena Herter, MD 12/22/23 [phone]

## 2023-12-22 NOTE — ED Triage Notes (Signed)
Pt here with a cough x1 month. Pt has been seen recently for the same. Pt states he was exposed to other sick people at work. Pt endorses diarrhea since this morning as well. Pt denies NVD or abd pain.

## 2023-12-27 ENCOUNTER — Ambulatory Visit: Payer: Self-pay | Admitting: Physician Assistant

## 2024-01-29 ENCOUNTER — Ambulatory Visit: Payer: Medicaid Other | Admitting: Physician Assistant

## 2024-07-24 ENCOUNTER — Other Ambulatory Visit: Payer: Self-pay

## 2024-07-24 ENCOUNTER — Emergency Department
Admission: EM | Admit: 2024-07-24 | Discharge: 2024-07-24 | Disposition: A | Discharge status: Home / Self Care | Payer: Self-pay

## 2024-07-24 DIAGNOSIS — I10 Essential (primary) hypertension: Secondary | ICD-10-CM | POA: Insufficient documentation

## 2024-07-24 DIAGNOSIS — M1000 Idiopathic gout, unspecified site: Secondary | ICD-10-CM | POA: Insufficient documentation

## 2024-07-24 MED ORDER — DEXAMETHASONE SODIUM PHOSPHATE 10 MG/ML IJ SOLN
10.0000 mg | Freq: Once | INTRAMUSCULAR | Status: AC
  Administered 2024-07-24: 10 mg via INTRAMUSCULAR
  Filled 2024-07-24: qty 1

## 2024-07-24 MED ORDER — COLCHICINE 0.6 MG PO TABS
1.8000 mg | ORAL_TABLET | Freq: Every day | ORAL | Status: AC
  Administered 2024-07-24: 1.8 mg via ORAL
  Filled 2024-07-24 (×2): qty 3

## 2024-07-24 MED ORDER — KETOROLAC TROMETHAMINE 30 MG/ML IJ SOLN
15.0000 mg | Freq: Once | INTRAMUSCULAR | Status: AC
  Administered 2024-07-24: 15 mg via INTRAMUSCULAR
  Filled 2024-07-24: qty 1

## 2024-07-24 MED ORDER — NAPROXEN 500 MG PO TBEC: 500.0000 mg | DELAYED_RELEASE_TABLET | Freq: Two times a day (BID) | ORAL | 2 refills | Status: AC

## 2024-07-24 MED ORDER — PREDNISONE 50 MG PO TABS: ORAL_TABLET | ORAL | 0 refills | Status: AC

## 2024-07-24 MED ORDER — COLCHICINE 0.6 MG PO TABS: 0.6000 mg | ORAL_TABLET | Freq: Every day | ORAL | 0 refills | Status: AC

## 2024-07-24 NOTE — ED Provider Notes (Signed)
 [facility] Provider Note    Event Date/Time   First MD Initiated Contact with Patient 07/24/24 1710     (approximate)   History   Toe Pain    HPI  Theodore Morton is a 53 y.o. male    with a past medical history of acute bronchitis, pneumonia, hypertension, gout, liver cyst, fatty liver, anxiety, neck pain, anemia, who presents to the ED complaining of pain in right great toe. According to the patient, symptoms started 15 days ago with right great toe pain, edema, tenderness.  Patient states this is the third episode, but is lasting longer.  He is not taking colchicine  every day as a treatment.  Patient does not have a PCP. Patient endorses having red meat on his diet. 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  Patient is here by himself.   Patient Active Problem List   Diagnosis Date Noted   Back pain 01/06/2017   Neck pain 12/25/2016   BRBPR (bright red blood per rectum) 12/13/2016   Anemia 12/08/2016   Chronic left shoulder pain 11/04/2016   Obesity (BMI 30-39.9) 11/04/2016   Essential hypertension 11/04/2016     ROS: Patient currently denies any vision changes, tinnitus, difficulty speaking, facial droop, sore throat, chest pain, shortness of breath, abdominal pain, nausea/vomiting/diarrhea, dysuria, or weakness/numbness/paresthesias in any extremity   Physical Exam   Triage Vital Signs: ED Triage Vitals  Encounter Vitals Group     BP 07/24/24 1547 (!) 150/98     Girls Systolic BP Percentile --      Girls Diastolic BP Percentile --      Boys Systolic BP Percentile --      Boys Diastolic BP Percentile --      Pulse Rate 07/24/24 1547 (!) 107     Resp 07/24/24 1547 19     Temp 07/24/24 1547 98 F (36.7 C)     Temp Source 07/24/24 1547 Oral     SpO2 07/24/24 1547 97 %     Weight 07/24/24 1550 200 lb (90.7 kg)     Height 07/24/24 1550 5' 7 (1.702 m)     Head Circumference --      Peak Flow --      Pain Score 07/24/24 1555 4      Pain Loc --      Pain Education --      Exclude from Growth Chart --     Most recent vital signs: Vitals:   07/24/24 1547  BP: (!) 150/98  Pulse: (!) 107  Resp: 19  Temp: 98 F (36.7 C)  SpO2: 97%     Physical Exam Vitals and nursing note reviewed.  During triage patient was hypertensive, tachycardic  Constitutional:      General: Awake and alert. No acute distress.    Appearance: Normal appearance. The patient is normal weight.      Able to speak in complete sentences without cough or dyspnea  HENT:     Head: Normocephalic and atraumatic.     Mouth: Mucous membranes are moist.  Eyes:     General: PERRL. Normal EOMs          Conjunctiva/sclera: Conjunctivae normal.  Nose No congestion/rhinorrhea  CV:                  Good peripheral perfusion.  Regular rate and rhythm  Resp:               Normal effort.  Equal  breath sounds bilaterally.  Abd:                 No distention.  Soft, nontender.  No rebound or guarding.  Musculoskeletal:        General: No swelling. Normal range of motion.  Right great toe: Skin is intact, presence of edema, warmness, erythema, tenderness to palpation.  Skin:    General: Skin is warm and dry.     Capillary Refill: Capillary refill takes less than 2 seconds.     Findings: No rash.  Neurological:     Mental Status: The patient is awake and alert. MAE spontaneously. No gross focal neurologic deficits are appreciated.  Psychiatric Mood and affect are normal. Speech and behavior are normal.  ED Results / Procedures / Treatments   Labs (all labs ordered are listed, but only abnormal results are displayed) Labs Reviewed - No data to display   EKG     RADIOLOGY     PROCEDURES:  Critical Care performed:   Procedures   MEDICATIONS ORDERED IN ED: Medications  ketorolac  (TORADOL ) 30 MG/ML injection 15 mg (has no administration in time range)  dexamethasone  (DECADRON ) injection 10 mg (has no administration in time range)   colchicine  tablet 1.8 mg (has no administration in time range)      IMPRESSION / MDM / ASSESSMENT AND PLAN / ED COURSE  I reviewed the triage vital signs and the nursing notes.  Differential diagnosis includes, but is not limited to, gout, rheumatoid arthritis, cellulitis, unlikely foreign body.  Patient's presentation is most consistent with acute, uncomplicated illness.    Theodore Morton is a 53 y.o., male who presents today with history of 15 days of pain on his right great toe.  Patient has history of gout, this is the third episode in the right great toe.  On physical exam there is evidence of edema, tenderness, erythema and warmness at the level of MTP of the right great toe.  Rest of the physical exam is normal Plan:  Toradol  IM Dexamethasone  IM Colchicine  1.8 mg Recheck vital signs Reassess Patient's diagnosis is consistent with gout attack in the right great toe. I I did not order any labs or imaging, physical exam is reassuring.  I did review the patient's allergies and medications.The patient is in stable and satisfactory condition for discharge home.  Patient will be discharged home with prescriptions for colchicine  0.6 mg tablet daily, prednisone , naproxen .  Patient is to follow up with PCP, orthopedics as needed or otherwise directed.  Patient does not have PCP I referred patient to [facility] clinic and [facility]. Patient is given ED precautions to return to the ED for any worsening or new symptoms. Discussed plan of care with patient, answered all of patient's questions, Patient agreeable to plan of care. Advised patient to take medications according to the instructions on the label. Discussed possible side effects of new medications. Patient verbalized understanding.    FINAL CLINICAL IMPRESSION(S) / ED DIAGNOSES   Final diagnoses:  Acute idiopathic gout, unspecified site     Rx / DC Orders   ED Discharge Orders          Ordered    colchicine  0.6 MG tablet  Daily        Note to Pharmacy: Patient can take colchicine  for the next 6 days, if symptoms continue he can take until supply last   07/24/24 1754    naproxen  (EC NAPROSYN ) 500 MG EC tablet  2 times daily with meals  07/24/24 1754    predniSONE  (DELTASONE ) 50 MG tablet        07/24/24 1754             Note:  This document was prepared using Dragon voice recognition software and may include unintentional dictation errors.   Janit Kast, PA-C 07/24/24 1825    Clarine Ozell LABOR, MD 07/26/24 [phone]

## 2024-07-24 NOTE — Discharge Instructions (Signed)
 You have been diagnosed with gout attack on your right great toe.  Please take colchicine  1 tablet daily for the next 6 days.  If you continue with the symptoms you can finish the prescription.  You can take naproxen  1 tablet by mouth every 12 hours with meals.  You can take prednisone  1 tablet with breakfast for the next 5 days.  Please call [facility] clinic and make an appointment to establish care with an PCP and have a follow-up of your gout.  Please follow the low purine diet to prevent future gout attacks.  Please come back to ED or go to your PCP if you have new symptoms or symptoms worsen

## 2024-07-24 NOTE — ED Triage Notes (Signed)
 Pt comes in via pov with complaints of gout in his right great toe that started 15 days ago. Pt has been trying to get thru the pain, that it's not clearing up. Pt has a history of gout, and feels that this is a flare up, but it's lasted longer than normal. Pt complains of pain 4/10 at this time.

## 2024-12-19 ENCOUNTER — Encounter: Payer: Self-pay | Admitting: Emergency Medicine

## 2024-12-19 ENCOUNTER — Emergency Department
Admission: EM | Admit: 2024-12-19 | Discharge: 2024-12-19 | Disposition: A | Discharge status: Home / Self Care | Payer: Self-pay | Attending: Emergency Medicine | Admitting: Emergency Medicine

## 2024-12-19 ENCOUNTER — Other Ambulatory Visit: Payer: Self-pay

## 2024-12-19 DIAGNOSIS — M109 Gout, unspecified: Secondary | ICD-10-CM | POA: Insufficient documentation

## 2024-12-19 DIAGNOSIS — I10 Essential (primary) hypertension: Secondary | ICD-10-CM | POA: Insufficient documentation

## 2024-12-19 MED ORDER — DEXAMETHASONE SOD PHOSPHATE PF 10 MG/ML IJ SOLN
10.0000 mg | Freq: Once | INTRAMUSCULAR | Status: AC
  Administered 2024-12-19: 10 mg via INTRAMUSCULAR
  Filled 2024-12-19: qty 1

## 2024-12-19 MED ORDER — KETOROLAC TROMETHAMINE 15 MG/ML IJ SOLN
15.0000 mg | Freq: Once | INTRAMUSCULAR | Status: AC
  Administered 2024-12-19: 15 mg via INTRAMUSCULAR
  Filled 2024-12-19: qty 1

## 2024-12-19 MED ORDER — COLCHICINE 0.6 MG PO TABS
1.2000 mg | ORAL_TABLET | Freq: Once | ORAL | Status: AC
  Administered 2024-12-19: 1.2 mg via ORAL
  Filled 2024-12-19: qty 2

## 2024-12-19 NOTE — Discharge Instructions (Signed)
 Take acetaminophen  650 mg and ibuprofen 400 mg every 6 hours for pain.  Take with food.  Drink plenty of fluids to stay well-hydrated.  Follow the low purine diet attached to help prevent future flareups.  I made I made a referral to a primary doctor who can call you to schedule a follow-up appointment.  At this appointment discussed long-term every day medications to prevent gout in the future, ask about allopurinol specifically.

## 2024-12-19 NOTE — ED Provider Notes (Signed)
 "  [facility] Provider Note    Event Date/Time   First MD Initiated Contact with Patient 12/19/24 0413     (approximate)   History   Foot Pain   HPI  Theodore Morton is a 54 y.o. male   Past medical history of gout, hypertension, here with what he believes is a gout flareup in his right great toe.  Has had multiple occurrences most recently in the summertime this past year.  No direct trauma.  Feels very similar to gout flares in the past.  Does not have a primary doctor.  Not on preventative medications.   External Medical Documents Reviewed: August hospital visit for gout flare      Physical Exam   Triage Vital Signs: ED Triage Vitals  Encounter Vitals Group     BP 12/19/24 0350 (!) 156/134     Girls Systolic BP Percentile --      Girls Diastolic BP Percentile --      Boys Systolic BP Percentile --      Boys Diastolic BP Percentile --      Pulse Rate 12/19/24 0350 (!) 107     Resp 12/19/24 0350 20     Temp 12/19/24 0350 98.3 F (36.8 C)     Temp Source 12/19/24 0350 Oral     SpO2 12/19/24 0350 98 %     Weight 12/19/24 0349 220 lb (99.8 kg)     Height 12/19/24 0349 5' 7 (1.702 m)     Head Circumference --      Peak Flow --      Pain Score 12/19/24 0349 10     Pain Loc --      Pain Education --      Exclude from Growth Chart --     Most recent vital signs: Vitals:   12/19/24 0350  BP: (!) 156/134  Pulse: (!) 107  Resp: 20  Temp: 98.3 F (36.8 C)  SpO2: 98%    General: Awake, no distress.  CV:  Good peripheral perfusion.  Resp:  Normal effort.  Abd:  No distention.  Other:  Mild swelling to the right great toe base.  Able to range.  No significant warmth.  No deformity.   ED Results / Procedures / Treatments   Labs (all labs ordered are listed, but only abnormal results are displayed) Labs Reviewed - No data to display  PROCEDURES:  Critical Care performed: No  Procedures   MEDICATIONS ORDERED IN ED: Medications   dexamethasone  (DECADRON ) injection 10 mg (10 mg Intramuscular Given 12/19/24 0434)  ketorolac  (TORADOL ) 15 MG/ML injection 15 mg (15 mg Intramuscular Given 12/19/24 0433)  colchicine  tablet 1.2 mg (1.2 mg Oral Given 12/19/24 0434)     IMPRESSION / MDM / ASSESSMENT AND PLAN / ED COURSE  I reviewed the triage vital signs and the nursing notes.                                Patient's presentation is most consistent with acute presentation with potential threat to life or bodily function.  Differential diagnosis includes, but is not limited to, gouty arthritis, considered but less likely fractures dislocation traumatic injury or septic joint    MDM:    Most consistent with his previous gout flares.  He got a lot better last time with Toradol  dexamethasone  and colchicine .  Will replicate this today.  No direct trauma to suggest trauma  and no evidence to suggest septic joint at this time.  Given information for low purine diet.  Given referral to primary doctor can consider allopurinol or other preventative measures. Appropriate for discharge.       FINAL CLINICAL IMPRESSION(S) / ED DIAGNOSES   Final diagnoses:  Gouty arthritis of right great toe     Rx / DC Orders   ED Discharge Orders          Ordered    Ambulatory Referral to Primary Care (Establish Care)        12/19/24 0428             Note:  This document was prepared using Dragon voice recognition software and may include unintentional dictation errors.    Cyrena Mylar, MD 12/19/24 [phone]  "

## 2024-12-19 NOTE — ED Triage Notes (Signed)
 Pt to triage via w/c with no distress noted; pt reports gout flare-up in his rt foot since Tuesday; taking no meds for such currently but has hx of same

## 2024-12-24 ENCOUNTER — Other Ambulatory Visit: Payer: Self-pay

## 2024-12-24 ENCOUNTER — Emergency Department
Admission: EM | Admit: 2024-12-24 | Discharge: 2024-12-24 | Disposition: A | Discharge status: Home / Self Care | Payer: Self-pay | Attending: Emergency Medicine | Admitting: Emergency Medicine

## 2024-12-24 ENCOUNTER — Encounter: Payer: Self-pay | Admitting: *Deleted

## 2024-12-24 DIAGNOSIS — I10 Essential (primary) hypertension: Secondary | ICD-10-CM | POA: Insufficient documentation

## 2024-12-24 DIAGNOSIS — M10071 Idiopathic gout, right ankle and foot: Secondary | ICD-10-CM | POA: Insufficient documentation

## 2024-12-24 MED ORDER — COLCHICINE 0.6 MG PO TABS
1.2000 mg | ORAL_TABLET | Freq: Once | ORAL | Status: AC
  Administered 2024-12-24: 1.2 mg via ORAL
  Filled 2024-12-24: qty 2

## 2024-12-24 MED ORDER — KETOROLAC TROMETHAMINE 30 MG/ML IJ SOLN
30.0000 mg | Freq: Once | INTRAMUSCULAR | Status: AC
  Administered 2024-12-24: 30 mg via INTRAMUSCULAR
  Filled 2024-12-24: qty 1

## 2024-12-24 MED ORDER — PREDNISONE 10 MG (21) PO TBPK: ORAL_TABLET | ORAL | 0 refills | Status: AC

## 2024-12-24 MED ORDER — PREDNISONE 20 MG PO TABS
ORAL_TABLET | ORAL | Status: AC
  Filled 2024-12-24: qty 3

## 2024-12-24 MED ORDER — PREDNISONE 20 MG PO TABS
60.0000 mg | ORAL_TABLET | Freq: Once | ORAL | Status: AC
  Administered 2024-12-24: 60 mg via ORAL

## 2024-12-24 MED ORDER — NAPROXEN 500 MG PO TBEC: 500.0000 mg | DELAYED_RELEASE_TABLET | Freq: Two times a day (BID) | ORAL | 0 refills | Status: AC

## 2024-12-24 NOTE — ED Notes (Signed)
 Patient resting in bed free from sign of distress. Breathing unlabored speaking in full sentences with symmetric chest rise and fall. Bed low and locked with side rails raised x1. Pt in view of nurses station

## 2024-12-24 NOTE — ED Provider Notes (Signed)
 "  [facility] Provider Note    Event Date/Time   First MD Initiated Contact with Patient 12/24/24 2047     (approximate)   History   Foot Pain   HPI  Theodore Morton is a 54 y.o. male who presents to the ED for evaluation of Foot Pain   Patient with history of gout and hypertension presents with concerns for gout worsening to his right great toe, and left great toe to a lesser degree.  Similar symptoms as when he was seen last week.  Has no regular medications, has not prescribed anything last time.  Has not seen a podiatrist   Physical Exam   Triage Vital Signs: ED Triage Vitals  Encounter Vitals Group     BP 12/24/24 1938 (!) 172/122     Girls Systolic BP Percentile --      Girls Diastolic BP Percentile --      Boys Systolic BP Percentile --      Boys Diastolic BP Percentile --      Pulse Rate 12/24/24 1936 (!) 102     Resp 12/24/24 1936 18     Temp 12/24/24 1938 98 F (36.7 C)     Temp Source 12/24/24 1936 Oral     SpO2 12/24/24 1938 98 %     Weight 12/24/24 1937 218 lb 4.1 oz (99 kg)     Height 12/24/24 1937 5' 7 (1.702 m)     Head Circumference --      Peak Flow --      Pain Score 12/24/24 1937 10     Pain Loc --      Pain Education --      Exclude from Growth Chart --     Most recent vital signs: Vitals:   12/24/24 1938 12/24/24 2221  BP: (!) 172/122 (!) 165/117  Pulse: 97 89  Resp:  18  Temp: 98 F (36.7 C)   SpO2: 98% 99%    General: Awake, no distress.  CV:  Good peripheral perfusion.  Resp:  Normal effort.  Abd:  No distention.  MSK:  No deformity noted.  Neuro:  No focal deficits appreciated. Other:  Mild swelling erythema and tenderness to the base of the right great toe, lesser degree to the left side   ED Results / Procedures / Treatments   Labs (all labs ordered are listed, but only abnormal results are displayed) Labs Reviewed - No data to display  EKG   RADIOLOGY   Official radiology report(s): No  results found.  PROCEDURES and INTERVENTIONS:  Procedures  Medications  predniSONE  (DELTASONE ) 20 MG tablet (has no administration in time range)  ketorolac  (TORADOL ) 30 MG/ML injection 30 mg (30 mg Intramuscular Given 12/24/24 2106)  colchicine  tablet 1.2 mg (1.2 mg Oral Given 12/24/24 2106)  predniSONE  (DELTASONE ) tablet 60 mg (60 mg Oral Given 12/24/24 2218)     IMPRESSION / MDM / ASSESSMENT AND PLAN / ED COURSE  I reviewed the triage vital signs and the nursing notes.  Differential diagnosis includes, but is not limited to, septic joint, gout, diabetic foot infection  Patient presents with signs of gout suitable for outpatient management with anti-inflammatories and podiatric follow-up.      FINAL CLINICAL IMPRESSION(S) / ED DIAGNOSES   Final diagnoses:  Acute idiopathic gout involving toe of right foot     Rx / DC Orders   ED Discharge Orders          Ordered  naproxen  (EC NAPROSYN ) 500 MG EC tablet  2 times daily with meals        12/24/24 2201    predniSONE  (STERAPRED UNI-PAK 21 TAB) 10 MG (21) TBPK tablet  Daily        12/24/24 2202             Note:  This document was prepared using Dragon voice recognition software and may include unintentional dictation errors.   Claudene Rover, MD 12/24/24 2325  "

## 2024-12-24 NOTE — ED Triage Notes (Signed)
 Pt ambulatory to triage.  Pt has pain in both feet  hx gout.  Pt was here last week with similar sx.  Pt alert.

## 2024-12-24 NOTE — ED Notes (Signed)
 MD notified of pt bp. No new orders and states to continue with pt discharge.

## 2024-12-24 NOTE — Discharge Instructions (Addendum)
 Naproxen /Aleve  prescription sent to the pharmacy, twice daily every day for the next week  Steroid taper over the next 10 days.  Steroids once daily  Follow-up with the foot doctor/podiatrist in the clinic

## 2024-12-24 NOTE — ED Notes (Signed)
Patient discharged at this time. Ambulated to lobby with independent and steady gait. Breathing unlabored speaking in full sentences. Verbalized understanding of all discharge, follow up, and medication teaching. Discharged homed with all belongings.
